# Patient Record
Sex: Male | Born: 1960 | Race: White | Hispanic: No | Marital: Married | Smoking: Never smoker
Health system: Southern US, Community
[De-identification: ages and names within clinical notes are randomized; demographics above are authoritative.]

## PROBLEM LIST (undated history)

## (undated) DIAGNOSIS — L0390 Cellulitis, unspecified: Secondary | ICD-10-CM

## (undated) DIAGNOSIS — L84 Corns and callosities: Secondary | ICD-10-CM

## (undated) HISTORY — DX: Corns and callosities: L84

## (undated) HISTORY — PX: HAND SURGERY: SHX662

## (undated) HISTORY — DX: Cellulitis, unspecified: L03.90

## (undated) HISTORY — PX: VASECTOMY: SHX75

---

## 2004-05-11 ENCOUNTER — Ambulatory Visit: Payer: Self-pay | Admitting: Family Medicine

## 2004-05-11 ENCOUNTER — Encounter: Admission: RE | Admit: 2004-05-11 | Discharge: 2004-05-11 | Payer: Self-pay | Admitting: Family Medicine

## 2004-05-22 ENCOUNTER — Ambulatory Visit: Payer: Self-pay | Admitting: Family Medicine

## 2005-05-27 ENCOUNTER — Ambulatory Visit: Payer: Self-pay | Admitting: Family Medicine

## 2005-06-14 ENCOUNTER — Ambulatory Visit: Payer: Self-pay | Admitting: Family Medicine

## 2005-09-15 IMAGING — CT CT PARANASAL SINUSES LIMITED
1 series · 16 of 30 positions shown, 20 images · IV contrast (agent unspecified)
Comparison: none

CLINICAL DATA: Chronic sinus infections. 
 CT PARANASAL SINUSES W/O CONTRAST: 
 Standard coronal CT imaging of the paranasal sinuses was performed.  The frontal, sphenoid, and maxillary sinuses are clear except for a minimal amount of mucoperiosteal thickening in the maxillary sinuses.  There is also some scattered minimal mucoperiosteal thickening involving the ethmoid sinuses.  The bony nasal septum demonstrates leftward deviation with a large bony spur which narrows the left middle and inferior meatus.  Both inferior turbinates are slightly thickened.  The osteomeatal complexes are patent.

[Series 2: prone coronal · axial · 0.33mm/px · z∈[+41,+127]mm · 16 of 36 slices shown, 20 images]
[im 2/36  brain]
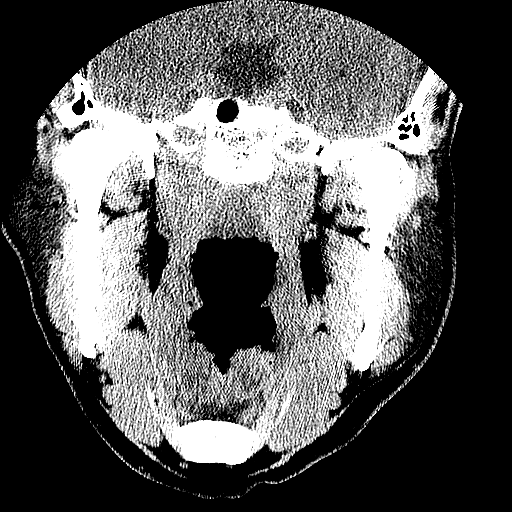
[im 2/36  bone]
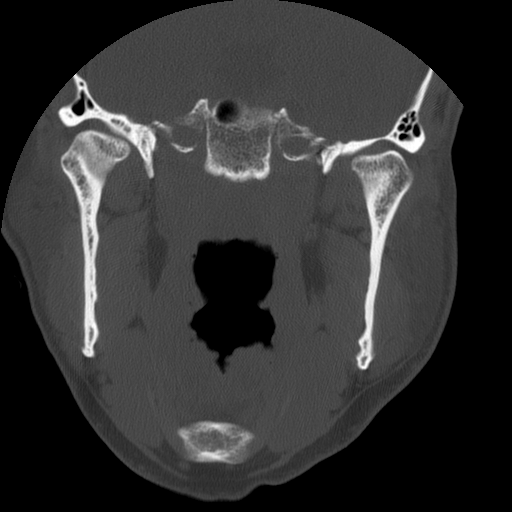
[im 4/36  bone]
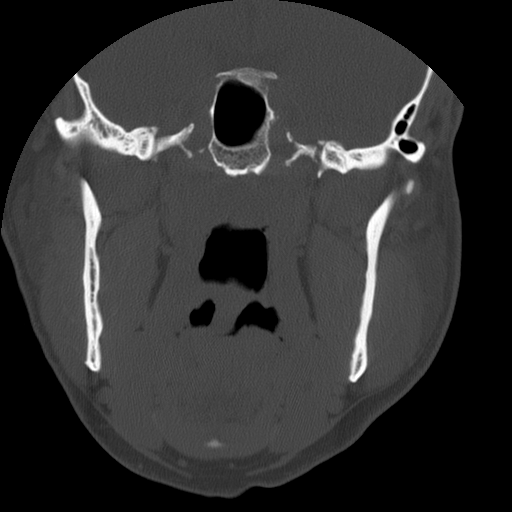
[im 7/36  bone]
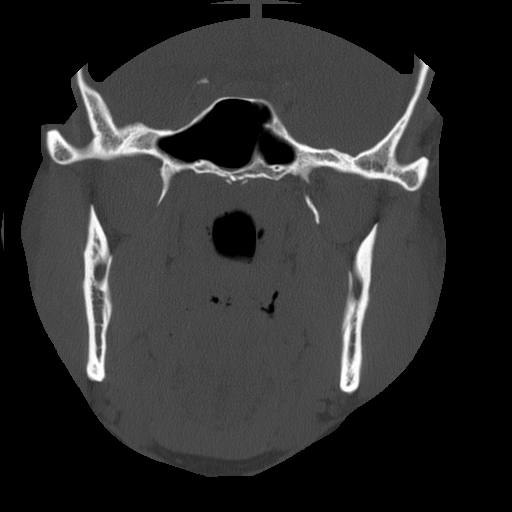
[im 9/36  bone]
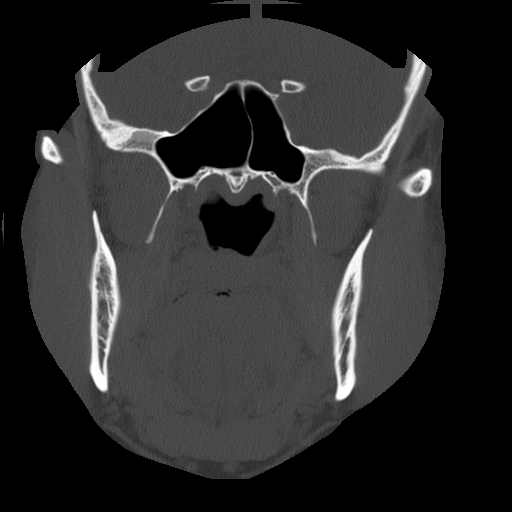
[im 10/36  brain]
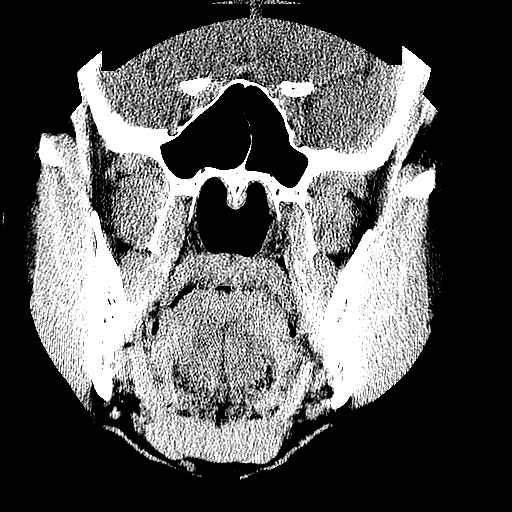
[im 10/36  bone]
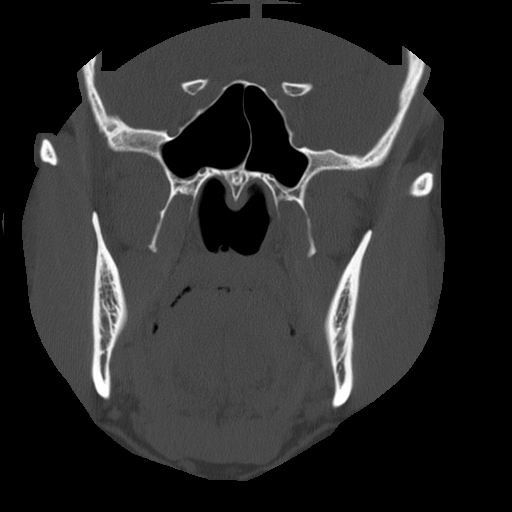
[im 13/36  bone]
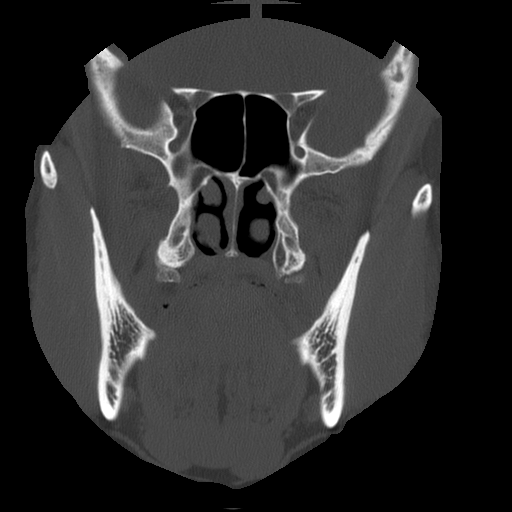
[im 15/36  bone]
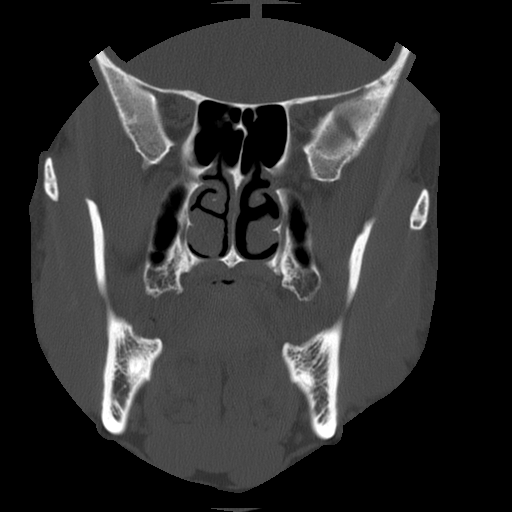
[im 17/36  bone]
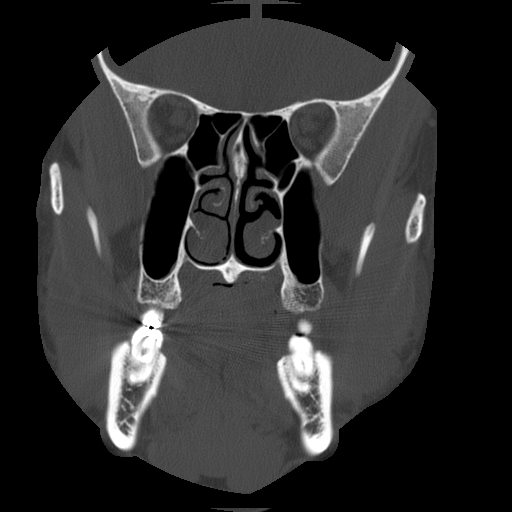
[im 19/36  brain]
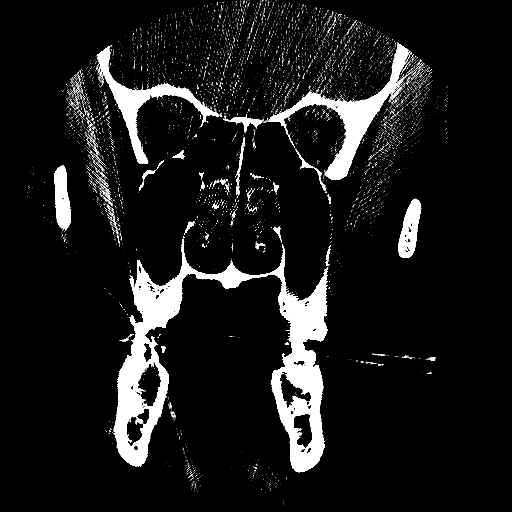
[im 19/36  bone]
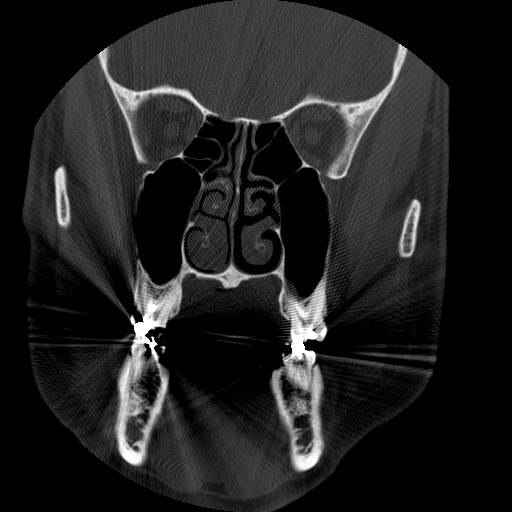
[im 21/36  bone]
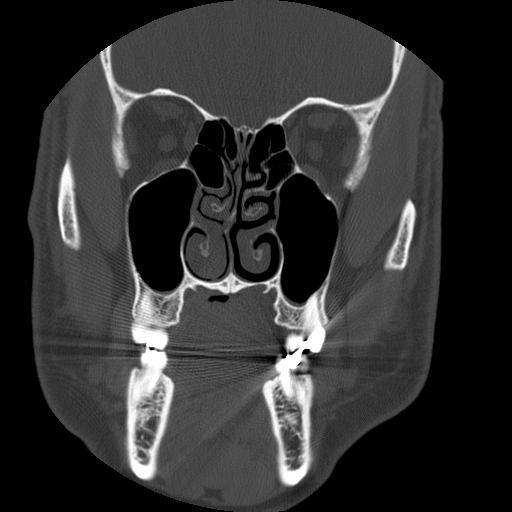
[im 23/36  bone]
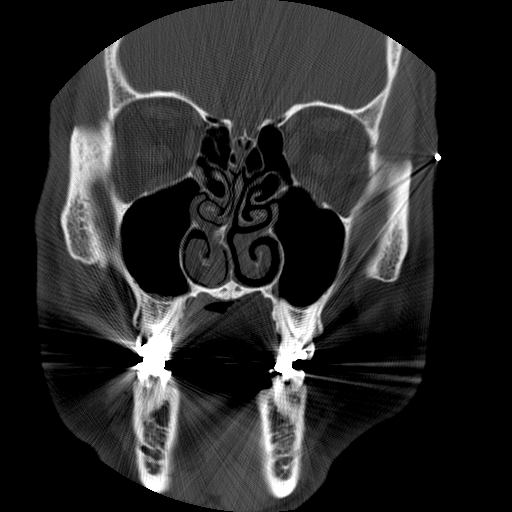
[im 26/36  bone]
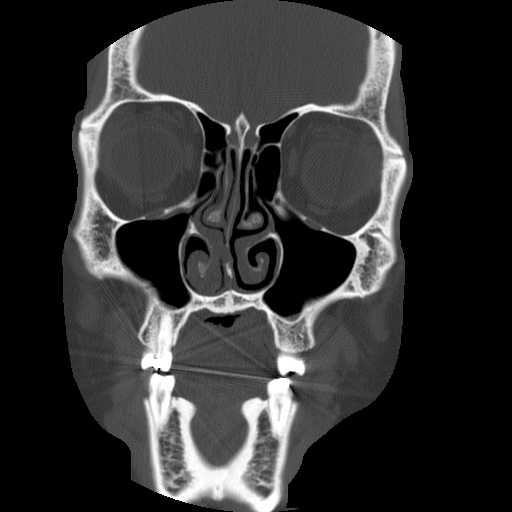
[im 27/36  brain]
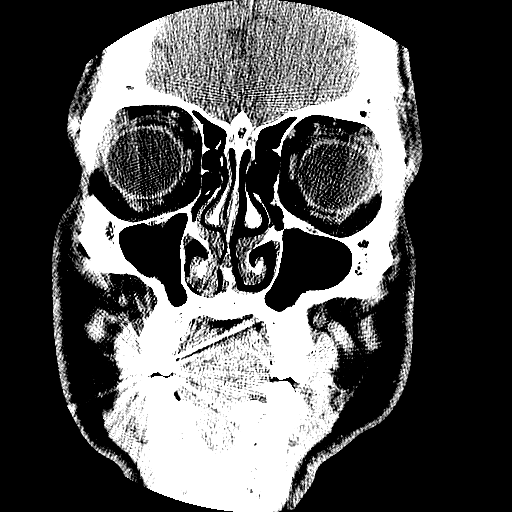
[im 27/36  bone]
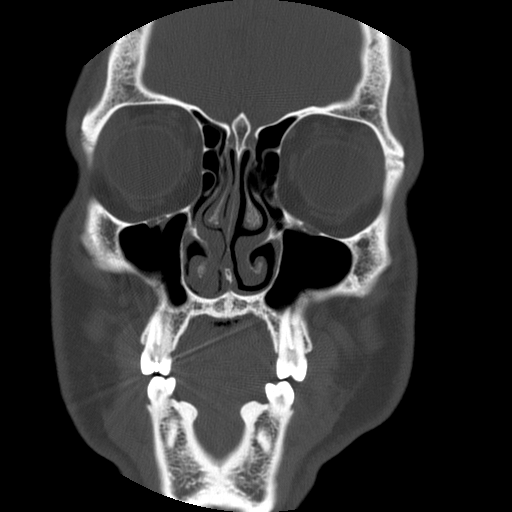
[im 29/36  bone]
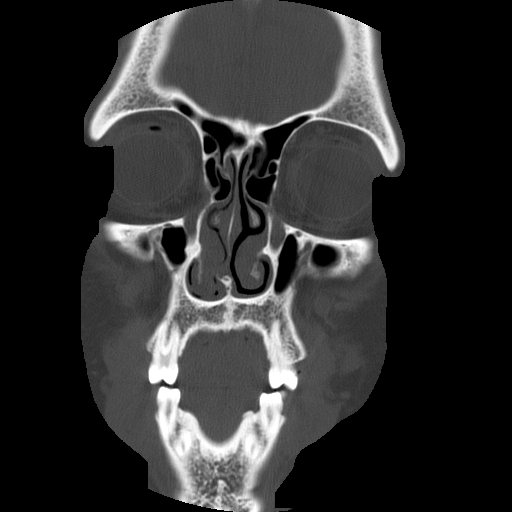
[im 32/36  bone]
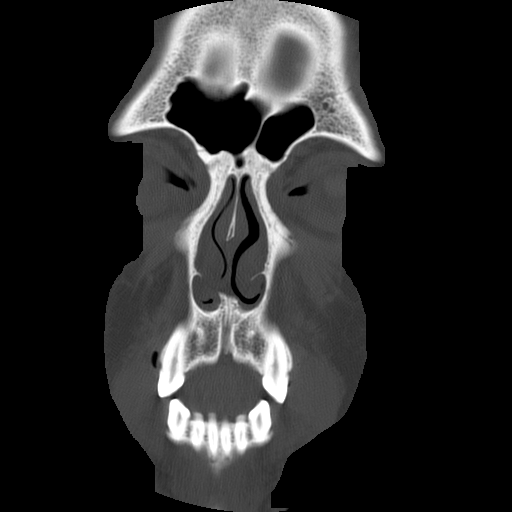
[im 34/36  bone]
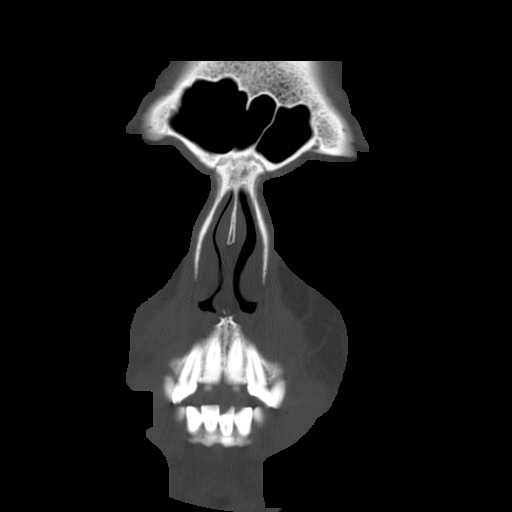

[16 of 30 positions shown; findings below may reference images not displayed]

IMPRESSION: 1.  Minimal mucoperiosteal thickening involving the maxillary and ethmoid sinuses.  No findings to suggest acute sinusitis. 
 2.  Leftward deviation of the bony nasal septum with leftward spurring with mild narrowing of the left middle and inferior meatus.  
 3.  Mild mucosal thickening involving the inferior turbinates.

## 2006-02-01 ENCOUNTER — Ambulatory Visit: Payer: Self-pay | Admitting: Family Medicine

## 2006-06-24 ENCOUNTER — Ambulatory Visit: Payer: Self-pay | Admitting: Family Medicine

## 2007-01-17 ENCOUNTER — Ambulatory Visit: Payer: Self-pay | Admitting: Family Medicine

## 2007-01-17 DIAGNOSIS — J019 Acute sinusitis, unspecified: Secondary | ICD-10-CM

## 2007-03-15 ENCOUNTER — Ambulatory Visit: Payer: Self-pay | Admitting: Family Medicine

## 2007-03-15 DIAGNOSIS — L03039 Cellulitis of unspecified toe: Secondary | ICD-10-CM

## 2007-03-15 DIAGNOSIS — L84 Corns and callosities: Secondary | ICD-10-CM

## 2007-03-15 DIAGNOSIS — L02619 Cutaneous abscess of unspecified foot: Secondary | ICD-10-CM | POA: Insufficient documentation

## 2007-06-21 ENCOUNTER — Telehealth (INDEPENDENT_AMBULATORY_CARE_PROVIDER_SITE_OTHER): Payer: Self-pay | Admitting: *Deleted

## 2007-06-22 ENCOUNTER — Ambulatory Visit: Payer: Self-pay | Admitting: Family Medicine

## 2008-01-21 ENCOUNTER — Emergency Department (HOSPITAL_COMMUNITY): Admission: EM | Admit: 2008-01-21 | Discharge: 2008-01-21 | Payer: Self-pay | Admitting: Family Medicine

## 2008-01-24 ENCOUNTER — Telehealth (INDEPENDENT_AMBULATORY_CARE_PROVIDER_SITE_OTHER): Payer: Self-pay | Admitting: *Deleted

## 2008-01-25 ENCOUNTER — Ambulatory Visit: Payer: Self-pay | Admitting: Family Medicine

## 2008-08-07 ENCOUNTER — Telehealth (INDEPENDENT_AMBULATORY_CARE_PROVIDER_SITE_OTHER): Payer: Self-pay | Admitting: *Deleted

## 2009-03-12 ENCOUNTER — Emergency Department (HOSPITAL_BASED_OUTPATIENT_CLINIC_OR_DEPARTMENT_OTHER): Admission: EM | Admit: 2009-03-12 | Discharge: 2009-03-12 | Payer: Self-pay | Admitting: Emergency Medicine

## 2009-03-12 ENCOUNTER — Ambulatory Visit: Payer: Self-pay | Admitting: Diagnostic Radiology

## 2009-04-15 ENCOUNTER — Telehealth (INDEPENDENT_AMBULATORY_CARE_PROVIDER_SITE_OTHER): Payer: Self-pay | Admitting: *Deleted

## 2009-04-16 ENCOUNTER — Encounter: Payer: Self-pay | Admitting: Family Medicine

## 2009-09-26 ENCOUNTER — Ambulatory Visit: Payer: Self-pay | Admitting: Family Medicine

## 2009-09-26 DIAGNOSIS — J301 Allergic rhinitis due to pollen: Secondary | ICD-10-CM

## 2010-05-31 LAB — CONVERTED CEMR LAB
ALT: 16 units/L (ref 0–53)
AST: 21 units/L (ref 0–37)
Alkaline Phosphatase: 53 units/L (ref 39–117)
Calcium: 9 mg/dL (ref 8.4–10.5)
Eosinophils Relative: 6.6 % — ABNORMAL HIGH (ref 0.0–5.0)
GFR calc non Af Amer: 97.33 mL/min (ref >60)
HCT: 43.3 % (ref 39.0–52.0)
Hemoglobin: 15 g/dL (ref 13.0–17.0)
LDL Cholesterol: 94 mg/dL (ref 0–99)
Lymphocytes Relative: 32 % (ref 12.0–46.0)
Lymphs Abs: 1.2 10*3/uL (ref 0.7–4.0)
Monocytes Relative: 9.3 % (ref 3.0–12.0)
Neutro Abs: 2 10*3/uL (ref 1.4–7.7)
Platelets: 199 10*3/uL (ref 150.0–400.0)
Potassium: 4.3 meq/L (ref 3.5–5.1)
Sodium: 142 meq/L (ref 135–145)
TSH: 1.84 microintl units/mL (ref 0.35–5.50)
Total Bilirubin: 1 mg/dL (ref 0.3–1.2)
VLDL: 7.6 mg/dL (ref 0.0–40.0)
WBC: 3.8 10*3/uL — ABNORMAL LOW (ref 4.5–10.5)

## 2010-06-03 NOTE — Assessment & Plan Note (Signed)
Summary: cpx/lab/cbs   Vital Signs:  Patient profile:   50 year old male Height:      72 inches Weight:      188.38 pounds BMI:     25.64 Pulse rate:   72 / minute Pulse rhythm:   regular BP sitting:   112 / 80  (left arm) Cuff size:   large  Vitals Entered By: Army Fossa CMA (Sep 26, 2009 8:50 AM) CC: Pt here for CPX, pt is fasting.    History of Present Illness: Pt here for cpe and labs.  No complaints.    Preventive Screening-Counseling & Management  Alcohol-Tobacco     Alcohol drinks/day: <1     Alcohol type: beer-ocassionally     Smoking Status: never  Caffeine-Diet-Exercise     Caffeine use/day: 2     Does Patient Exercise: yes     Type of exercise: p90x, gym, weight, run     Exercise (avg: min/session): >60     Times/week: 3-5  Hep-HIV-STD-Contraception     Dental Visit-last 6 months yes     Dental Care Counseling: not indicated; dental care within six months      Sexual History:  currently monogamous and married.        Drug Use:  no.    Current Medications (verified): 1)  Allegra 180 Mg  Tabs (Fexofenadine Hcl) .Marland Kitchen.. 1 By Mouth As Needed 2)  Veramyst 27.5 Mcg/spray Susp (Fluticasone Furoate) .... 2 Sprays Each Nostril Once Daily  Allergies: 1)  ! Pcn 2)  ! Erythromycin  Past History:  Past Medical History: Current Problems:  CELLULITIS, TOE (ICD-681.10) CALLUS, LEFT FOOT (ICD-700) SINUSITIS, ACUTE NOS (ICD-461.9)  Past Surgical History: Vasectomy hand surgery--table saw accident  Family History: Reviewed history from 03/13/2007 and no changes required. Father: DMII Mother:  Siblings: sister-syncope PGF--MI Family History Diabetes 1st degree relative Family History Hypertension  Social History: Reviewed history and no changes required. Occupation:  self employed -- makes Doctor, hospital Never Smoked Alcohol use-yes Drug use-no Regular exercise-yes Smoking Status:  never Caffeine use/day:  2 Dental Care w/in 6 mos.:   yes Sexual History:  currently monogamous, married Occupation:  employed Drug Use:  no  Review of Systems      See HPI General:  Denies chills, fatigue, fever, loss of appetite, malaise, sleep disorder, sweats, weakness, and weight loss. Eyes:  Denies blurring, discharge, double vision, eye irritation, eye pain, halos, itching, light sensitivity, red eye, vision loss-1 eye, and vision loss-both eyes; optho- q2y---+ hard contacts. ENT:  Denies decreased hearing, difficulty swallowing, ear discharge, earache, hoarseness, nasal congestion, nosebleeds, postnasal drainage, ringing in ears, sinus pressure, and sore throat. CV:  Denies bluish discoloration of lips or nails, chest pain or discomfort, difficulty breathing at night, difficulty breathing while lying down, fainting, fatigue, leg cramps with exertion, lightheadness, near fainting, palpitations, shortness of breath with exertion, swelling of feet, swelling of hands, and weight gain. Resp:  Denies chest discomfort, chest pain with inspiration, cough, coughing up blood, excessive snoring, hypersomnolence, morning headaches, pleuritic, shortness of breath, sputum productive, and wheezing. GI:  Denies abdominal pain, bloody stools, change in bowel habits, constipation, dark tarry stools, diarrhea, excessive appetite, gas, hemorrhoids, indigestion, loss of appetite, and nausea. GU:  Denies decreased libido, discharge, dysuria, erectile dysfunction, genital sores, hematuria, incontinence, nocturia, urinary frequency, and urinary hesitancy. MS:  Denies joint pain, joint redness, joint swelling, loss of strength, low back pain, mid back pain, muscle aches, muscle , cramps, muscle weakness, stiffness,  and thoracic pain. Derm:  Denies changes in color of skin, changes in nail beds, dryness, excessive perspiration, flushing, hair loss, insect bite(s), itching, lesion(s), poor wound healing, and rash. Neuro:  Denies brief paralysis, difficulty with  concentration, disturbances in coordination, falling down, headaches, inability to speak, memory loss, numbness, poor balance, seizures, sensation of room spinning, tingling, tremors, visual disturbances, and weakness. Psych:  Denies alternate hallucination ( auditory/visual), anxiety, depression, easily angered, easily tearful, irritability, mental problems, panic attacks, sense of great danger, suicidal thoughts/plans, thoughts of violence, unusual visions or sounds, and thoughts /plans of harming others. Endo:  Denies cold intolerance, excessive hunger, excessive thirst, excessive urination, heat intolerance, polyuria, and weight change. Heme:  Denies abnormal bruising, bleeding, enlarge lymph nodes, fevers, pallor, and skin discoloration. Allergy:  Denies hives or rash, itching eyes, persistent infections, seasonal allergies, and sneezing.  Physical Exam  General:  Well-developed,well-nourished,in no acute distress; alert,appropriate and cooperative throughout examination Head:  Normocephalic and atraumatic without obvious abnormalities. No apparent alopecia or balding. Eyes:  vision grossly intact, pupils equal, pupils round, pupils reactive to light, and no injection.   Ears:  External ear exam shows no significant lesions or deformities.  Otoscopic examination reveals clear canals, tympanic membranes are intact bilaterally without bulging, retraction, inflammation or discharge. Hearing is grossly normal bilaterally. Nose:  External nasal examination shows no deformity or inflammation. Nasal mucosa are pink and moist without lesions or exudates. Mouth:  Oral mucosa and oropharynx without lesions or exudates.  Teeth in good repair. Neck:  No deformities, masses, or tenderness noted.no carotid bruits.   Chest Wall:  No deformities, masses, tenderness or gynecomastia noted. Lungs:  Normal respiratory effort, chest expands symmetrically. Lungs are clear to auscultation, no crackles or  wheezes. Heart:  normal rate and no murmur.   Abdomen:  Bowel sounds positive,abdomen soft and non-tender without masses, organomegaly or hernias noted. Rectal:  No external abnormalities noted. Normal sphincter tone. No rectal masses or tenderness. Genitalia:  Testes bilaterally descended without nodularity, tenderness or masses. No scrotal masses or lesions. No penis lesions or urethral discharge. Prostate:  Prostate gland firm and smooth, no enlargement, nodularity, tenderness, mass, asymmetry or induration. Msk:  normal ROM, no joint tenderness, no joint swelling, no joint warmth, no redness over joints, no joint deformities, no joint instability, and no crepitation.   Pulses:  R posterior tibial normal, R dorsalis pedis normal, R carotid normal, L posterior tibial normal, L dorsalis pedis normal, and L carotid normal.   Extremities:  No clubbing, cyanosis, edema, or deformity noted with normal full range of motion of all joints.   Neurologic:  No cranial nerve deficits noted. Station and gait are normal. Plantar reflexes are down-going bilaterally. DTRs are symmetrical throughout. Sensory, motor and coordinative functions appear intact. Skin:  Intact without suspicious lesions or rashes Cervical Nodes:  No lymphadenopathy noted Psych:  Cognition and judgment appear intact. Alert and cooperative with normal attention span and concentration. No apparent delusions, illusions, hallucinations   Impression & Recommendations:  Problem # 1:  PREVENTIVE HEALTH CARE (ICD-V70.0) ghm utd Orders: Venipuncture (16109) TLB-Lipid Panel (80061-LIPID) TLB-BMP (Basic Metabolic Panel-BMET) (80048-METABOL) TLB-CBC Platelet - w/Differential (85025-CBCD) TLB-Hepatic/Liver Function Pnl (80076-HEPATIC) TLB-TSH (Thyroid Stimulating Hormone) (84443-TSH) TLB-PSA (Prostate Specific Antigen) (84153-PSA) EKG w/ Interpretation (93000)  Reviewed preventive care protocols, scheduled due services, and updated  immunizations.  Problem # 2:  ALLERGIC RHINITIS, SEASONAL (ICD-477.0)  veramyst  allegra  Orders: EKG w/ Interpretation (93000)  Complete Medication List: 1)  Allegra 180  Mg Tabs (Fexofenadine hcl) .Marland Kitchen.. 1 by mouth as needed 2)  Veramyst 27.5 Mcg/spray Susp (Fluticasone furoate) .... 2 sprays each nostril once daily Prescriptions: VERAMYST 27.5 MCG/SPRAY SUSP (FLUTICASONE FUROATE) 2 sprays each nostril once daily  #1 x 5   Entered and Authorized by:   Loreen Freud DO   Signed by:   Loreen Freud DO on 09/26/2009   Method used:   Electronically to        [facility] (retail)       [phone] [address]       [place], [place]  [id number]       Ph: [phone]       Fax: [phone]   RxID:   [id number]    EKG  Procedure date:  09/26/2009  Findings:      Sinus bradycardia with rate of:  52    Appended Document: cpx/lab/cbs  Laboratory Results   Urine Tests   Date/Time Reported: Sep 26, 2009 10:02 AM   Routine Urinalysis   Color: straw Appearance: Clear Glucose: negative   (Normal Range: Negative) Bilirubin: negative   (Normal Range: Negative) Ketone: negative   (Normal Range: Negative) Spec. Gravity: 1.010   (Normal Range: 1.003-1.035) Blood: negative   (Normal Range: Negative) pH: 7.5   (Normal Range: 5.0-8.0) Protein: 30   (Normal Range: Negative) Urobilinogen: 0.2   (Normal Range: 0-1) Nitrite: negative   (Normal Range: Negative) Leukocyte Esterace: negative   (Normal Range: Negative)    Comments: Floydene Flock  Sep 26, 2009 10:03 AM

## 2010-06-11 ENCOUNTER — Encounter: Payer: Self-pay | Admitting: Family Medicine

## 2010-06-11 ENCOUNTER — Ambulatory Visit (INDEPENDENT_AMBULATORY_CARE_PROVIDER_SITE_OTHER): Payer: BC Managed Care – PPO | Admitting: Family Medicine

## 2010-06-11 DIAGNOSIS — R05 Cough: Secondary | ICD-10-CM

## 2010-06-18 NOTE — Assessment & Plan Note (Signed)
Summary: cough, cold, sinus///sph   Vital Signs:  Patient profile:   50 year old male Weight:      191 pounds BMI:     26.00 Temp:     98.4 degrees F oral BP sitting:   120 / 68  (left arm)  Vitals Entered By: Doristine Devoid CMA (June 11, 2010 11:10 AM) CC: sinus congestion and drainage x2 days    History of Present Illness: 50 yo man here today for ? sinus infxn.  reports hx of frequent sinus infxns.  sxs started 2 days ago w/ runny nose, scratchy throat.  having frontal sinus pressure and 'slight' HA.  no ear pain.  cough is productive of 'greenish brown' sputum.  no fevers.  + sick contacts- mom has PNA.  Current Medications (verified): 1)  Allegra 180 Mg  Tabs (Fexofenadine Hcl) .Marland Kitchen.. 1 By Mouth As Needed 2)  Veramyst 27.5 Mcg/spray Susp (Fluticasone Furoate) .... 2 Sprays Each Nostril Once Daily  Allergies (verified): 1)  ! Pcn 2)  ! Erythromycin  Review of Systems      See HPI  Physical Exam  General:  Well-developed,well-nourished,in no acute distress; alert,appropriate and cooperative throughout examination Head:  Normocephalic and atraumatic without obvious abnormalities. No apparent alopecia or balding.  no TTP over sinuses Eyes:  no injxn or inflammation Ears:  External ear exam shows no significant lesions or deformities.  Otoscopic examination reveals clear canals, tympanic membranes are intact bilaterally without bulging, retraction, inflammation or discharge. Hearing is grossly normal bilaterally. Nose:  External nasal examination shows no deformity or inflammation. Nasal mucosa are pink and moist without lesions or exudates. Mouth:  Oral mucosa and oropharynx without lesions or exudates.  Teeth in good repair. Neck:  No deformities, masses, or tenderness noted.no carotid bruits.   Lungs:  coarse BS in RML, otherwise CTA Heart:  normal rate and no murmur.     Impression & Recommendations:  Problem # 1:  COUGH (ICD-786.2) Assessment New suspect PNA given  pt's recent exposure and coarse BS in RML.  start Doxy- discussed starting Avelox but pt is avid runner and not willing to accept risk of tendon rupture.  reviewed supportive care and red flags that should prompt return.  Pt expresses understanding and is in agreement w/ this plan.  Complete Medication List: 1)  Allegra 180 Mg Tabs (Fexofenadine hcl) .Marland Kitchen.. 1 by mouth as needed 2)  Veramyst 27.5 Mcg/spray Susp (Fluticasone furoate) .... 2 sprays each nostril once daily 3)  Doxycycline Hyclate 100 Mg Caps (Doxycycline hyclate) .... Take 1 tab twice a day.  take w/ food.  Patient Instructions: 1)  Take the Doxycycline as directed for a possible early pneumonia 2)  Mucinex to thin your congestion 3)  Add Robitussin or Delsym as needed for cough 4)  Tylenol or Ibuprofen as needed for pain or fever 5)  Drink plenty of fluids 6)  REST! 7)  Call with any questions or concerns- especially if not improving 8)  Hang in there!!! Prescriptions: VERAMYST 27.5 MCG/SPRAY SUSP (FLUTICASONE FUROATE) 2 sprays each nostril once daily  #1 x 5   Entered and Authorized by:   Neena Rhymes MD   Signed by:   Neena Rhymes MD on 06/11/2010   Method used:   Electronically to        [facility] (retail)       [phone]       [place],  [place]  [id number]       Ph: [phone]       Fax: [phone]   RxID:   [id number] DOXYCYCLINE HYCLATE 100 MG CAPS (DOXYCYCLINE HYCLATE) Take 1 tab twice a day.  take w/ food.  #20 x 0   Entered and Authorized by:   Neena Rhymes MD   Signed by:   Neena Rhymes MD on 06/11/2010   Method used:   Electronically to        [facility] (retail)       [phone] [address]       [place], [place]  [id number]       Ph: [phone]       Fax: [phone]   RxID:   [id number]    Orders Added: 1)  Est. Patient Level III [20254]

## 2010-09-15 NOTE — Assessment & Plan Note (Signed)
[facility]                                 ON-CALL NOTE   NAME:James Dodson, James Dodson                       MRN:          [id number]  DATE:01/21/2008                            DOB:          03-02-1961    TIME:  07:47 p.m.   PHONE NUMBER:  [phone].   REGULAR DOCTOR:  He is unsure of his regular doctor.  His wife is the  caller, James Dodson.   CHIEF COMPLIANT:  Fever.   James Dodson said they had both been sick this week with cough,  lightheadedness, headache, and fever.  She started hers on Wednesday and  it is starting to get better.  She is coughing a bit, but he has a  temperature of 102 that seems to be going up and up  and is feeling very  bad.  She wants to take him in for more further evaluation.  I told her  to go ahead and take him to the emergency room or to come down here for  evaluation now.  He could have a flu or possible pneumonia, or another  viral syndrome.  I also advised her that he can take Tylenol or Advil  for fever to try to keep it down in the meantime.     Marne A. Tower, MD  Electronically Signed    MAT/MedQ  DD: 01/21/2008  DT: 01/21/2008  Job #: [id number]

## 2011-01-15 ENCOUNTER — Ambulatory Visit (INDEPENDENT_AMBULATORY_CARE_PROVIDER_SITE_OTHER): Payer: BC Managed Care – PPO | Admitting: Family

## 2011-01-15 ENCOUNTER — Encounter: Payer: Self-pay | Admitting: Family Medicine

## 2011-01-15 ENCOUNTER — Encounter: Payer: Self-pay | Admitting: Family

## 2011-01-15 VITALS — BP 124/86 | HR 64 | Temp 98.4°F | Resp 12 | Ht 72.01 in | Wt 201.1 lb

## 2011-01-15 DIAGNOSIS — J329 Chronic sinusitis, unspecified: Secondary | ICD-10-CM

## 2011-01-15 MED ORDER — DOXYCYCLINE HYCLATE 100 MG PO CAPS: 100.0000 mg | ORAL_CAPSULE | Freq: Two times a day (BID) | ORAL | Status: AC

## 2011-01-15 NOTE — Assessment & Plan Note (Signed)
Will plan to treat with doxycycline given allergy profile.   Pt is instructed to continue his allegra and Veramyst and contact us if symptoms worsen or if they fail to improve in 2-3 days. Pt verbalizes understanding.

## 2011-01-15 NOTE — Progress Notes (Signed)
  Subjective:    Patient ID: James Dodson, male    DOB: 08-04-1960, 50 y.o.   MRN: [id number]  HPI  James Dodson is a 50 yr old male who presents today with cc of sinus drainage. Symptoms started Wednesday. Notes + chills, denies fever.    Had sore throat yesterday. + cough becoming yellow/green in color.  Mild dizziness, feels drained and weak.   Review of Systems See HPI  Past Medical History  Diagnosis Date  . Cellulitis     toe  . Callus     left toe    History   Social History  . Marital Status: Married    Spouse Name: N/A    Number of Children: N/A  . Years of Education: N/A   Occupational History  . Not on file.   Social History Main Topics  . Smoking status: Never Smoker   . Smokeless tobacco: Never Used  . Alcohol Use: Yes  . Drug Use: No  . Sexually Active: Not on file   Other Topics Concern  . Not on file   Social History Narrative   Self employed--makes furnitureRegular exercise:  Yes    Past Surgical History  Procedure Date  . Vasectomy   . Hand surgery     table saw accident    Family History  Problem Relation Age of Onset  . Diabetes Father   . Heart attack Paternal Grandfather     Allergies  Allergen Reactions  . Erythromycin   . Penicillins     No current outpatient prescriptions on file prior to visit.    BP 124/86  Pulse 64  Temp(Src) 98.4 F (36.9 C) (Oral)  Resp 12  Ht 6' 0.01" (1.829 m)  Wt 201 lb 1.9 oz (91.227 kg)  BMI 27.27 kg/m2  SpO2 96%       Objective:   Physical Exam  Constitutional: He appears well-developed and well-nourished.  HENT:  Head: Normocephalic and atraumatic.  Right Ear: Tympanic membrane and ear canal normal.  Left Ear: Tympanic membrane and ear canal normal.  Mouth/Throat: Uvula is midline, oropharynx is clear and moist and mucous membranes are normal.       No significant sinus tenderness to palpation.   Cardiovascular: Normal rate and regular rhythm.   No murmur  heard. Pulmonary/Chest: Effort normal and breath sounds normal. No respiratory distress. He has no wheezes. He has no rales.  Psychiatric: He has a normal mood and affect. His behavior is normal. Judgment and thought content normal.          Assessment & Plan:

## 2011-01-15 NOTE — Patient Instructions (Signed)
Call if your symptoms worsen, if fever over 101, or if you are not feeling better in 2-3 days.

## 2012-01-05 ENCOUNTER — Telehealth: Payer: Self-pay | Admitting: Family Medicine

## 2012-01-05 NOTE — Telephone Encounter (Signed)
Refill: Veramyst 27.5 mcg/spray susp. 2 sprays each nostril once daily. Last fill 04-19-11

## 2012-01-05 NOTE — Telephone Encounter (Signed)
Pt needs ov---can refill 1 only

## 2012-01-05 NOTE — Telephone Encounter (Signed)
FYI:Pt last OV noted 01-15-11 with Sandford Craze for sinus, noted last OV with MD Lowne as CPE 09-26-09

## 2012-01-06 MED ORDER — FLUTICASONE FUROATE 27.5 MCG/SPRAY NA SUSP: NASAL | Status: DC

## 2012-04-05 ENCOUNTER — Telehealth: Payer: Self-pay | Admitting: Family Medicine

## 2012-04-05 NOTE — Telephone Encounter (Signed)
Noted, patient to be seen tomorrow

## 2012-04-05 NOTE — Telephone Encounter (Signed)
Patient Information:  Caller Name: Doyne  Phone: [phone]  Patient: James Dodson, James Dodson  Gender: Male  DOB: 26-Jan-1961  Age: 51 Years  PCP: Lelon Perla.   Symptoms  Reason For Call & Symptoms: started with congestion and sore throat; having some drainage; denies HA or pressure; coughing up clear mucus; having some chest tightness; h/o pneumonia; slight dizziness  Reviewed Health History In EMR: Yes  Reviewed Medications In EMR: Yes  Reviewed Allergies In EMR: Yes  Reviewed Surgeries / Procedures: Yes  Date of Onset of Symptoms: 04/02/2012  Treatments Tried: Mucinex, Allegra, Veramyst  Treatments Tried Worked: No  Guideline(s) Used:  Cough  Disposition Per Guideline:   Go to Office Now  Reason For Disposition Reached:   Wheezing is present  Advice Given:  Reassurance  Coughing is the way that our lungs remove irritants and mucus. It helps protect our lungs from getting pneumonia.  Prevent Dehydration:  Drink adequate liquids.  Coughing Spasms:  Drink warm fluids. Inhale warm mist (Reason: both relax the airway and loosen up the phlegm).  Avoid Tobacco Smoke:  Smoking or being exposed to smoke makes coughs much worse.  Expected Course:   Viral bronchitis (chest cold) causes a cough that lasts 1 to 3 weeks. Sometimes you may cough up lots of phlegm (sputum, mucus). The mucus can normally be white, gray, yellow, or green.  Call Back If:  Difficulty breathing  Cough lasts more than 3 weeks  Call Back If:  Difficulty breathing  Cough lasts more than 3 weeks  You become worse.  Office Follow Up:  Does the office need to follow up with this patient?: Yes  Instructions For The Office: Pt c/o slight wheezing so needed appt today, but none available in epic

## 2012-04-05 NOTE — Telephone Encounter (Signed)
Spoke with patient, made appt tomorrow with Dr. Alwyn Ren at 1:30pm. Marlinda Mike, RN

## 2012-04-06 ENCOUNTER — Ambulatory Visit (INDEPENDENT_AMBULATORY_CARE_PROVIDER_SITE_OTHER): Payer: BC Managed Care – PPO | Admitting: Internal Medicine

## 2012-04-06 ENCOUNTER — Encounter: Payer: Self-pay | Admitting: Internal Medicine

## 2012-04-06 VITALS — BP 126/80 | HR 60 | Temp 97.7°F | Wt 215.6 lb

## 2012-04-06 DIAGNOSIS — J209 Acute bronchitis, unspecified: Secondary | ICD-10-CM

## 2012-04-06 MED ORDER — DOXYCYCLINE HYCLATE 100 MG PO TABS: 100.0000 mg | ORAL_TABLET | Freq: Two times a day (BID) | ORAL | Status: DC

## 2012-04-06 NOTE — Patient Instructions (Addendum)
Plain Mucinex for thick secretions ;force NON dairy fluids . Use a Neti pot daily as needed for sinus congestion; going from open side to congested side . Nasal cleansing in the shower as discussed. Make sure that all residual soap is removed to prevent irritation.Veramyst 1 spray in each nostril twice a day as needed. Use the "crossover" technique as discussed. Plain Allegra 160 daily as needed for itchy eyes & sneezing.

## 2012-04-06 NOTE — Progress Notes (Signed)
  Subjective:    Patient ID: James Dodson, male    DOB: April 11, 1961, 51 y.o.   MRN: [id number]  HPI  Symptoms began 04/02/12 as an increase in postnasal drainage associated with some sore throat. He developed a cough which has been dry for the most part; he does have a loose cough in the mornings.  He may have had some wheezing; he denies shortness of breath. He has had slight dizziness with the present illness.  He has taken his nonsedating antihistamines, nasal steroid, and guaifenesin.  These are symptoms he typically has this time of year. Last year he had pneumonia. He has never smoked. He does have  history of allergic seasonal rhinitis   Review of Systems He denies associated extrinsic symptoms such as itchy, watery eyes or sneezing. He's had no fever, chills, or sweats. He also has had no purulence from his nose, dental pain, or otic pain     Objective:   Physical Exam General appearance:good health ;well nourished; no acute distress or increased work of breathing is present.  No  lymphadenopathy about the head, neck, or axilla noted.   Eyes: No conjunctival inflammation or lid edema is present.   Ears:  External ear exam shows no significant lesions or deformities.  Otoscopic examination reveals wax on L Nose:  External nasal examination shows no deformity or inflammation. Nasal mucosa are dry without lesions or exudates. No septal dislocation or deviation.No obstruction to airflow.   Oral exam: Dental hygiene is good; lips and gums are healthy appearing.There is no oropharyngeal erythema or exudate noted.   Neck:  No deformities,  masses, or tenderness noted.      Heart:  Normal rate and regular rhythm. S1 and S2 normal without gallop, murmur, click, rub or other extra sounds.   Lungs:Chest clear to auscultation; no wheezes, rhonchi,rales ,or rubs present.No increased work of breathing.    Extremities:  No cyanosis, edema, or clubbing  noted    Skin: Warm & dry            Assessment & Plan:  #1 acute bronchitis w/o bronchospasm Plan: See orders and recommendations

## 2013-05-25 ENCOUNTER — Encounter (INDEPENDENT_AMBULATORY_CARE_PROVIDER_SITE_OTHER): Payer: Self-pay

## 2013-05-25 ENCOUNTER — Encounter: Payer: Self-pay | Admitting: Family Medicine

## 2013-05-25 ENCOUNTER — Ambulatory Visit (INDEPENDENT_AMBULATORY_CARE_PROVIDER_SITE_OTHER): Payer: BC Managed Care – PPO | Admitting: Family Medicine

## 2013-05-25 VITALS — BP 134/88 | HR 88 | Temp 98.8°F | Resp 16 | Wt 227.4 lb

## 2013-05-25 DIAGNOSIS — J209 Acute bronchitis, unspecified: Secondary | ICD-10-CM

## 2013-05-25 DIAGNOSIS — R6882 Decreased libido: Secondary | ICD-10-CM | POA: Insufficient documentation

## 2013-05-25 DIAGNOSIS — J329 Chronic sinusitis, unspecified: Secondary | ICD-10-CM

## 2013-05-25 MED ORDER — SULFAMETHOXAZOLE-TMP DS 800-160 MG PO TABS: 1.0000 | ORAL_TABLET | Freq: Two times a day (BID) | ORAL | Status: DC

## 2013-05-25 MED ORDER — IPRATROPIUM-ALBUTEROL 0.5-2.5 (3) MG/3ML IN SOLN
3.0000 mL | Freq: Once | RESPIRATORY_TRACT | Status: AC
  Administered 2013-05-25: 3 mL via RESPIRATORY_TRACT

## 2013-05-25 MED ORDER — SILDENAFIL CITRATE 100 MG PO TABS: 50.0000 mg | ORAL_TABLET | Freq: Every day | ORAL | Status: DC | PRN

## 2013-05-25 MED ORDER — PROMETHAZINE-DM 6.25-15 MG/5ML PO SYRP: 5.0000 mL | ORAL_SOLUTION | Freq: Four times a day (QID) | ORAL | Status: DC | PRN

## 2013-05-25 NOTE — Patient Instructions (Signed)
Follow up as needed Start the Bactrim twice daily for the sinus infection- take w/ food Drink plenty of fluids REST! Cough syrup as needed- will cause drowsiness Mucinex DM for daytime cough We'll notify you of your lab results and make any changes if needed Start the Viagra as needed Hang in there!!

## 2013-05-25 NOTE — Progress Notes (Signed)
   Subjective:    Patient ID: James Holteronald Boesch Jr., male    DOB: 02/04/1961, 53 y.o.   MRN: [id number]  HPI URI- sxs started 1 week ago w/ sore throat.  Has had similar sxs previously.  + PND.  Using [employer], taking Allegra, Mucinex.  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  Now w/ cough productive of brown sputum as well as brown nasal drainage.  + HA, facial pain.  'horrible cough'.  + sick contacts.  No N/V/D.  Decreased Libido- pt reports that he has had sex twice in 2 yrs and has 'no interest'.  Wife is frustrated by this.  Pt also having ED.  Weight gain.   Review of Systems For ROS see HPI     Objective:   Physical Exam  Vitals reviewed. Constitutional: He appears well-developed and well-nourished. No distress.  HENT:  Head: Normocephalic and atraumatic.  Right Ear: Tympanic membrane normal.  Left Ear: Tympanic membrane normal.  Nose: Mucosal edema and rhinorrhea present. Right sinus exhibits maxillary sinus tenderness and frontal sinus tenderness. Left sinus exhibits maxillary sinus tenderness and frontal sinus tenderness.  Mouth/Throat: Mucous membranes are normal. Oropharyngeal exudate and posterior oropharyngeal erythema present. No posterior oropharyngeal edema.  + PND  Eyes: Conjunctivae and EOM are normal. Pupils are equal, round, and reactive to light.  Neck: Normal range of motion. Neck supple.  Cardiovascular: Normal rate, regular rhythm and normal heart sounds.   Pulmonary/Chest: Effort normal. No respiratory distress. He has wheezes (diffuse expiratory wheezes). He has rales (coarse BS diffusely).  + hacking cough  Lymphadenopathy:    He has no cervical adenopathy.  Skin: Skin is warm and dry.          Assessment & Plan:

## 2013-05-27 NOTE — Assessment & Plan Note (Signed)
New.  Pt's sxs consistent w/ low T- check labs.  If low, will need testosterone therapy via PCP or Endo. In the meantime, start Viagra for symptom improvement.  Pt expressed understanding and is in agreement w/ plan.

## 2013-05-27 NOTE — Assessment & Plan Note (Signed)
New.  Pt's wheezing, rhonchi and air movement improved s/p neb tx.  Start abx.  Cough meds prn.  Reviewed supportive care and red flags that should prompt return.  Pt expressed understanding and is in agreement w/ plan.

## 2013-05-27 NOTE — Assessment & Plan Note (Signed)
New to provider.  Start abx.  Reviewed supportive care and red flags that should prompt return.  Pt expressed understanding and is in agreement w/ plan.

## 2013-05-28 ENCOUNTER — Telehealth: Payer: Self-pay | Admitting: *Deleted

## 2013-05-28 ENCOUNTER — Other Ambulatory Visit: Payer: Self-pay | Admitting: Family Medicine

## 2013-05-28 DIAGNOSIS — R7989 Other specified abnormal findings of blood chemistry: Secondary | ICD-10-CM

## 2013-05-28 LAB — TESTOSTERONE, FREE, TOTAL, SHBG
Sex Hormone Binding: 39 nmol/L (ref 13–71)
TESTOSTERONE-% FREE: 1.8 % (ref 1.6–2.9)
TESTOSTERONE: 295 ng/dL — AB (ref 300–890)
Testosterone, Free: 52.2 pg/mL (ref 47.0–244.0)

## 2013-05-28 NOTE — Telephone Encounter (Signed)
Left message for patient to return call to office with the medication information

## 2013-05-28 NOTE — Telephone Encounter (Signed)
Patient called back and stated that he was given a coupon for Viagra. Advised patient that we do not have anymore coupons but did offer him sample of Viagra 50mg . Patient states that he will like to try them. Samples placed up front for patient.

## 2013-05-28 NOTE — Telephone Encounter (Signed)
Patient called and stated that he was seen in office on 05/25/2013. Patient was giving a coupon card for medication. Patient states that he went to the pharmacy but the coupon could not be used because it had expired. Call patient back to clarify which medication it was, but patient didn't answer. Please advise. SW

## 2013-05-28 NOTE — Telephone Encounter (Signed)
Please determine which med pt needs...will do our best to find another coupon

## 2013-05-31 ENCOUNTER — Ambulatory Visit (INDEPENDENT_AMBULATORY_CARE_PROVIDER_SITE_OTHER): Payer: BC Managed Care – PPO | Admitting: Endocrinology

## 2013-05-31 ENCOUNTER — Encounter: Payer: Self-pay | Admitting: Endocrinology

## 2013-05-31 VITALS — BP 128/82 | HR 72 | Temp 98.2°F | Resp 14 | Ht 73.0 in | Wt 229.0 lb

## 2013-05-31 DIAGNOSIS — R400 Somnolence: Secondary | ICD-10-CM

## 2013-05-31 DIAGNOSIS — G4710 Hypersomnia, unspecified: Secondary | ICD-10-CM

## 2013-05-31 DIAGNOSIS — R7989 Other specified abnormal findings of blood chemistry: Secondary | ICD-10-CM

## 2013-05-31 DIAGNOSIS — E291 Testicular hypofunction: Secondary | ICD-10-CM

## 2013-05-31 DIAGNOSIS — R635 Abnormal weight gain: Secondary | ICD-10-CM

## 2013-05-31 DIAGNOSIS — R5381 Other malaise: Secondary | ICD-10-CM

## 2013-05-31 DIAGNOSIS — R5383 Other fatigue: Secondary | ICD-10-CM

## 2013-05-31 NOTE — Progress Notes (Signed)
Patient ID: James Dodson., male   DOB: 01-Aug-1960, 53 y.o.   MRN: [id number]   Chief complaint:   Decreased libido  History of Present Illness  For the last 2 years or so he has had very little libido and desire. He also is overall feeling that he is less motivated both at work and with his usual sporting activities and exercise routine He does tend to get tired more easily at the end of the day. Does not think that his muscle strength is decreased He does have some mild stress; does have difficulty sleeping for the last few years. His mood may be low at times. For the last 6 months he has gained about 20 pounds from decrease physical activity especially related to his knee joint pains Also he feels that he has had decreased erectile function and has very little nocturnal erections also  He thinks he may have had mumps during childhood but also h had respiratory infections and swollen neck glands Does not have any history of head injury, testicular injury, headaches or visual difficulties   Lab Results  Component Value Date   TESTOSTERONE 295* 05/25/2013   His testosterone level was drawn at about noon and his free testosterone was low normal at 52 Has had no other labs and is now referred here for further management     Medication List       This list is accurate as of: 05/31/13 11:23 AM.  Always use your most recent med list.               fexofenadine 180 MG tablet  Commonly known as:  ALLEGRA  Take 180 mg by mouth daily as needed.     fluticasone 27.5 MCG/SPRAY nasal spray  Commonly known as:  VERAMYST  2 spray into the nose daily(Office visit due now)     NASACORT ALLERGY 24HR 55 MCG/ACT Aero nasal inhaler  Generic drug:  triamcinolone  Place 2 sprays into the nose daily.     promethazine-dextromethorphan 6.25-15 MG/5ML syrup  Commonly known as:  PROMETHAZINE-DM  Take 5 mLs by mouth 4 (four) times daily as needed.     sildenafil 100 MG tablet  Commonly  known as:  VIAGRA  Take 0.5-1 tablets (50-100 mg total) by mouth daily as needed for erectile dysfunction.     sulfamethoxazole-trimethoprim 800-160 MG per tablet  Commonly known as:  BACTRIM DS  Take 1 tablet by mouth 2 (two) times daily.        Allergies:  Allergies  Allergen Reactions  . Penicillins     His father had anaphylaxis; he's never taken PCN @ advice of Allergist  . Erythromycin     GI intolerance    Past Medical History  Diagnosis Date  . Cellulitis     toe  . Callus     left toe    Past Surgical History  Procedure Laterality Date  . Vasectomy    . Hand surgery      table saw accident    Family History  Problem Relation Age of Onset  . Diabetes Father   . Heart attack Paternal Grandfather     Social History:  reports that he has never smoked. He has never used smokeless tobacco. He reports that he drinks alcohol. He reports that he does not use illicit drugs.  Review of Systems  HENT:       Has had allergic rhinitis  Eyes: Negative for blurred vision.  Neurological: Negative for  tingling and headaches.    He may tend to feel sleepy while driving for an hour or so. This is relatively new He has no insomnia and occasionally will have snoring. No ankle swelling No history of diabetes in the past but no recent labs available on the record  General Examination:   BP 128/82  Pulse 72  Temp(Src) 98.2 F (36.8 C)  Resp 14  Ht 6\' 1"  (1.854 m)  Wt 229 lb (103.874 kg)  BMI 30.22 kg/m2  SpO2 96%  GENERAL APPEARANCE: Well-built and nourished, no cushingoid features, minimal obesity SKIN:normal, no rash or pigmentation.  HEENT:Oral mucosa normal.  EYES:normal external appearance of eyes, Fundi benign.   NECK:no lymphadenopathy, no thyromegaly.  LUNGS:clear to auscultation bilaterally, no wheezes, rhonchi, rales.   HEART:normal S1 And S2, no S3, S4, murmur or click.  ABDOMEN:no hepatosplenomegaly, no  masses palpated, soft and not tender.   MALE GENITOURINARY: Testicles are about 3-3.5 cm, normal texture MUSCULOSKELETALNo enlargement or deformity of joints.  EXTREMITIES:no clubbing, no edema.  NEUROLOGIC EXAM: Biceps and ankle reflexes difficult to elicit. Monofilament sensation normal in the toes   Assessment/ Plan:   ? Hypogonadism. Although he has a slightly low total testosterone has free testosterone is within normal range His labs were drawn midday and not clear if this is a true level His main symptoms are decreased libido and lack of motivation. However these are rather nonspecific symptoms Overall he is also having multiple other issues including weight gain, some daytime somnolence and a low mood.   Physical examination unremarkable today.  Will need to assess his free testosterone level fasting in the morning Also do need to check [facility] level as well as prolactin to assess his pituitary function and rule out a prolactinoma Because of his weight gain will need to rule out prediabetes and have fasting glucose and chemistry panel checked Have discussed briefly the causes of hypogonadism and how the supplementation is done if required Most likely if he needs supplementation it would be with a transdermal preparation for more consistent day to day levels  ? Sleep apnea. Will defer assessment to the primary care physician  James Dodson 05/31/2013, 11:23 AM

## 2013-06-01 ENCOUNTER — Other Ambulatory Visit (INDEPENDENT_AMBULATORY_CARE_PROVIDER_SITE_OTHER): Payer: BC Managed Care – PPO

## 2013-06-01 DIAGNOSIS — R5383 Other fatigue: Secondary | ICD-10-CM

## 2013-06-01 DIAGNOSIS — E291 Testicular hypofunction: Secondary | ICD-10-CM

## 2013-06-01 DIAGNOSIS — R5381 Other malaise: Secondary | ICD-10-CM

## 2013-06-01 DIAGNOSIS — R7989 Other specified abnormal findings of blood chemistry: Secondary | ICD-10-CM

## 2013-06-01 LAB — COMPREHENSIVE METABOLIC PANEL
ALT: 32 U/L (ref 0–53)
AST: 27 U/L (ref 0–37)
Albumin: 3.9 g/dL (ref 3.5–5.2)
Alkaline Phosphatase: 82 U/L (ref 39–117)
BILIRUBIN TOTAL: 0.8 mg/dL (ref 0.3–1.2)
BUN: 14 mg/dL (ref 6–23)
CO2: 21 meq/L (ref 19–32)
CREATININE: 1.1 mg/dL (ref 0.4–1.5)
Calcium: 9.3 mg/dL (ref 8.4–10.5)
Chloride: 110 mEq/L (ref 96–112)
GFR: 77.5 mL/min (ref >60.00)
GLUCOSE: 97 mg/dL (ref 70–99)
Potassium: 4.6 mEq/L (ref 3.5–5.1)
Sodium: 145 mEq/L (ref 135–145)
Total Protein: 7.8 g/dL (ref 6.0–8.3)

## 2013-06-01 LAB — TSH: TSH: 2.1 u[IU]/mL (ref 0.35–5.50)

## 2013-06-01 LAB — LUTEINIZING HORMONE: LH: 3.33 m[IU]/mL (ref 1.50–9.30)

## 2013-06-01 LAB — T4, FREE: FREE T4: 0.84 ng/dL (ref 0.60–1.60)

## 2013-06-02 LAB — PROLACTIN: PROLACTIN: 8.5 ng/mL (ref 2.1–17.1)

## 2013-06-03 NOTE — Progress Notes (Signed)
Quick Note:  Please let patient know that the morning total testosterone and other tests are normal. Will update this if the (pending) free testosterone is low; however do not think testosterone supplementation is indicated, to followup with PCP ______

## 2013-06-04 LAB — TESTOSTERONE, FREE, TOTAL, SHBG
Sex Hormone Binding: 34 nmol/L (ref 13–71)
Testosterone, Free: 81 pg/mL (ref 47.0–244.0)
Testosterone-% Free: 2 % (ref 1.6–2.9)
Testosterone: 405 ng/dL (ref 300–890)

## 2013-08-10 ENCOUNTER — Ambulatory Visit (INDEPENDENT_AMBULATORY_CARE_PROVIDER_SITE_OTHER): Payer: BC Managed Care – PPO | Admitting: Family Medicine

## 2013-08-10 ENCOUNTER — Encounter: Payer: Self-pay | Admitting: Family Medicine

## 2013-08-10 VITALS — BP 110/70 | HR 65 | Temp 97.7°F | Wt 236.0 lb

## 2013-08-10 DIAGNOSIS — J329 Chronic sinusitis, unspecified: Secondary | ICD-10-CM

## 2013-08-10 DIAGNOSIS — Z0000 Encounter for general adult medical examination without abnormal findings: Secondary | ICD-10-CM

## 2013-08-10 MED ORDER — FLUTICASONE FUROATE 27.5 MCG/SPRAY NA SUSP: NASAL | Status: DC

## 2013-08-10 MED ORDER — SULFAMETHOXAZOLE-TMP DS 800-160 MG PO TABS: 1.0000 | ORAL_TABLET | Freq: Two times a day (BID) | ORAL | Status: DC

## 2013-08-10 NOTE — Progress Notes (Signed)
  Subjective:     James HolterRonald Gaultney Jr. is a 53 y.o. male who presents for evaluation of sinus pain. Symptoms include: congestion, facial pain, headaches, nasal congestion, sinus pressure and sneezing. Onset of symptoms was 1 week ago. Symptoms have been gradually worsening since that time. Past history is significant for no history of pneumonia or bronchitis. Patient is a non-smoker.  Pt is taking allegra and flonase and it is worse.    The following portions of the patient's history were reviewed and updated as appropriate: allergies, current medications, past family history, past medical history, past social history, past surgical history and problem list.  Review of Systems Pertinent items are noted in HPI.   Objective:    BP 110/70  Pulse 65  Temp(Src) 97.7 F (36.5 C) (Oral)  Wt 236 lb (107.049 kg)  SpO2 97% General appearance: alert, cooperative, appears stated age and no distress Ears: normal TM's and external ear canals both ears Nose: green discharge, moderate congestion, turbinates red, swollen, sinus tenderness bilateral Throat: lips, mucosa, and tongue normal; teeth and gums normal Neck: moderate anterior cervical adenopathy, supple, symmetrical, trachea midline and thyroid not enlarged, symmetric, no tenderness/mass/nodules Lungs: clear to auscultation bilaterally Heart: S1, S2 normal    Assessment:    Acute bacterial sinusitis.    Plan:    Nasal steroids per medication orders. Antihistamines per medication orders. Bactrim per medication orders. f/u prn

## 2013-08-10 NOTE — Patient Instructions (Signed)

## 2013-08-10 NOTE — Progress Notes (Signed)
Pre visit review using our clinic review tool, if applicable. No additional management support is needed unless otherwise documented below in the visit note. 

## 2013-09-10 ENCOUNTER — Encounter: Payer: Self-pay | Admitting: Family Medicine

## 2013-10-08 ENCOUNTER — Encounter: Payer: BC Managed Care – PPO | Admitting: Family Medicine

## 2014-02-04 ENCOUNTER — Encounter: Payer: Self-pay | Admitting: Medical

## 2014-02-04 ENCOUNTER — Ambulatory Visit (INDEPENDENT_AMBULATORY_CARE_PROVIDER_SITE_OTHER): Payer: BC Managed Care – PPO | Admitting: Medical

## 2014-02-04 ENCOUNTER — Ambulatory Visit: Payer: Self-pay | Admitting: Medical

## 2014-02-04 ENCOUNTER — Other Ambulatory Visit (HOSPITAL_COMMUNITY)
Admission: RE | Admit: 2014-02-04 | Discharge: 2014-02-04 | Disposition: A | Discharge status: Home / Self Care | Payer: BC Managed Care – PPO | Source: Ambulatory Visit | Attending: Medical | Admitting: Medical

## 2014-02-04 VITALS — BP 135/83 | HR 76 | Temp 98.6°F | Ht 71.0 in | Wt 226.0 lb

## 2014-02-04 DIAGNOSIS — Z113 Encounter for screening for infections with a predominantly sexual mode of transmission: Secondary | ICD-10-CM | POA: Diagnosis not present

## 2014-02-04 DIAGNOSIS — K589 Irritable bowel syndrome without diarrhea: Secondary | ICD-10-CM

## 2014-02-04 DIAGNOSIS — Z202 Contact with and (suspected) exposure to infections with a predominantly sexual mode of transmission: Secondary | ICD-10-CM | POA: Insufficient documentation

## 2014-02-04 NOTE — Progress Notes (Signed)
Subjective:    Patient ID: James Dodson., male    DOB: [birth year], 53 y.o.   MRN: [id number]  HPI  Pt in stating that he may have had some exposure to std about 12 days ago. He had some relations with person he does not know. He was drinking at time of this behaviour. He was wearing a condom and only hand manipulation. Pt states he had no sex with his wife in a year and half. States no sex with anyone than his wife in his entire life.  Pt was drinking a lot before this happened and recently since this promiscuous behavior he stopped drinking.  Mild upset stomach recently. No gerd symptoms. Recent with anxiety with above worries about std.  He  had mild loose stool intermittent with more gastric digestive sounds at times. I advised metamucil with 1 round tablespoon in 8 oz of water. He describes GI symtpoms coinciding with anxiety. No fever, no chills, no nausea or vomiting. No black or bloody stools. No chest or back pain.  Past Medical History  Diagnosis Date  . Cellulitis     toe  . Callus     left toe    History   Social History  . Marital Status: Married    Spouse Name: N/A    Number of Children: N/A  . Years of Education: N/A   Occupational History  . Not on file.   Social History Main Topics  . Smoking status: Never Smoker   . Smokeless tobacco: Never Used  . Alcohol Use: Yes  . Drug Use: No  . Sexual Activity: Not on file   Other Topics Concern  . Not on file   Social History Narrative   Self employed--makes furniture      Regular exercise:  Yes             Past Surgical History  Procedure Laterality Date  . Vasectomy    . Hand surgery      table saw accident    Family History  Problem Relation Age of Onset  . Diabetes Father   . Heart attack Paternal Grandfather     Allergies  Allergen Reactions  . Penicillins     His father had anaphylaxis; he's never taken PCN @ advice of Allergist  . Erythromycin     GI intolerance    Current  Outpatient Prescriptions on File Prior to Visit  Medication Sig Dispense Refill  . fexofenadine (ALLEGRA) 180 MG tablet Take 180 mg by mouth daily as needed.        . fluticasone (VERAMYST) 27.5 MCG/SPRAY nasal spray 2 spray into the nose daily  10 g  0  . sildenafil (VIAGRA) 100 MG tablet Take 0.5-1 tablets (50-100 mg total) by mouth daily as needed for erectile dysfunction.  10 tablet  11  . sulfamethoxazole-trimethoprim (BACTRIM DS) 800-160 MG per tablet Take 1 tablet by mouth 2 (two) times daily.  20 tablet  0   No current facility-administered medications on file prior to visit.    BP 135/83  Pulse 76  Temp(Src) 98.6 F (37 C) (Oral)  Ht 5\' 11"  (1.803 m)  Wt 226 lb (102.513 kg)  BMI 31.53 kg/m2  SpO2 96%     Review of Systems  Constitutional: Negative for fever, chills and fatigue.  HENT: Negative.   Respiratory: Negative for cough, chest tightness, shortness of breath and wheezing.   Cardiovascular: Negative for chest pain and palpitations.  Gastrointestinal: Negative.  Genitourinary: Negative.   Musculoskeletal: Negative for back pain, joint swelling, myalgias, neck pain and neck stiffness.       Objective:   Physical Exam  Constitutional: He is oriented to person, place, and time. He appears well-developed and well-nourished. No distress.  Neck: Normal range of motion. Neck supple. No JVD present. No tracheal deviation present. No thyromegaly present.  Cardiovascular: Normal rate, regular rhythm and normal heart sounds.   Pulmonary/Chest: Effort normal and breath sounds normal. No stridor. No respiratory distress. He has no wheezes. He exhibits no tenderness.  Abdominal: Soft. Bowel sounds are normal. He exhibits no distension and no mass. There is no tenderness. There is no rebound and no guarding.  Genitourinary:  Deferred since no genitourinary complaints and pt wearing condom at the time event  Lymphadenopathy:    He has no cervical adenopathy.  Neurological:  He is alert and oriented to person, place, and time. No cranial nerve deficit. Coordination normal.  Skin: He is not diaphoretic.  Psychiatric: He has a normal mood and affect. His behavior is normal. Judgment and thought content normal.  Very anxious about possible std although mechanism highly unlikely.          Assessment & Plan:

## 2014-02-04 NOTE — Assessment & Plan Note (Signed)
Recent anxiety with possible std followed by varying days of mild loose stools and occasional constipation. Advised conservative measure metamucil. If GI symptoms persist expand work up. So minimal symptoms today that I did not think work up needed today.

## 2014-02-04 NOTE — Assessment & Plan Note (Signed)
Remote possible. Pt is very concerned fo std and  will go ahead and do urine ancillary testing(gonnorhea, chlamydia, trichomonas), and get hiv with rpr.

## 2014-02-04 NOTE — Patient Instructions (Signed)
For your possible std exposure we are doing labs and urine studies.   For your mild on and off upset stomach with bouts of mild loose stools and constipation, I advise use metamucil 1 rounded tablespoon in 8 oz of water 3 x a day.(These symptoms may represent IBS since they seem to correlate with recent stress) If your stomach symptoms worsens or change notify us.  Follow up in 1-2 months for PE or as needed.

## 2014-02-05 LAB — HIV ANTIBODY (ROUTINE TESTING W REFLEX): HIV: NONREACTIVE

## 2014-02-05 LAB — RPR

## 2014-02-06 LAB — URINE CYTOLOGY ANCILLARY ONLY
Chlamydia: NEGATIVE
Neisseria Gonorrhea: NEGATIVE
TRICH (WINDOWPATH): NEGATIVE

## 2014-04-02 ENCOUNTER — Ambulatory Visit (INDEPENDENT_AMBULATORY_CARE_PROVIDER_SITE_OTHER): Payer: BC Managed Care – PPO | Admitting: Family Medicine

## 2014-04-02 ENCOUNTER — Encounter: Payer: Self-pay | Admitting: Family Medicine

## 2014-04-02 VITALS — BP 126/76 | HR 69 | Temp 98.2°F | Ht 71.0 in | Wt 235.8 lb

## 2014-04-02 DIAGNOSIS — Z0000 Encounter for general adult medical examination without abnormal findings: Secondary | ICD-10-CM

## 2014-04-02 LAB — POCT URINALYSIS DIPSTICK
Bilirubin, UA: NEGATIVE
Blood, UA: NEGATIVE
Glucose, UA: NEGATIVE
KETONES UA: NEGATIVE
LEUKOCYTES UA: NEGATIVE
NITRITE UA: NEGATIVE
PROTEIN UA: NEGATIVE
Spec Grav, UA: 1.015
UROBILINOGEN UA: 0.2
pH, UA: 6

## 2014-04-02 NOTE — Progress Notes (Signed)
Subjective:    Patient ID: James Holteronald Mills Jr., male    DOB: 10/25/1960, 53 y.o.   MRN: [id number]  HPI Pt here for cpe and labs.  C/o numbness in L toe since skiing a double shift over the weekend.    Past Medical History  Diagnosis Date  . Cellulitis     toe  . Callus     left toe   History   Social History  . Marital Status: Married    Spouse Name: N/A    Number of Children: N/A  . Years of Education: N/A   Occupational History  . Musicianfurniture builder      self employed  . ski instructor      [employer]   Social History Main Topics  . Smoking status: Never Smoker   . Smokeless tobacco: Never Used  . Alcohol Use: Yes  . Drug Use: No  . Sexual Activity: Yes   Other Topics Concern  . Not on file   Social History Narrative   Self employed--makes furniture      Regular exercise:  Yes            Family History  Problem Relation Age of Onset  . Diabetes Father   . Heart attack Paternal Grandfather    Current Outpatient Prescriptions  Medication Sig Dispense Refill  . fexofenadine (ALLEGRA) 180 MG tablet Take 180 mg by mouth daily as needed.      . sildenafil (VIAGRA) 100 MG tablet Take 0.5-1 tablets (50-100 mg total) by mouth daily as needed for erectile dysfunction. 10 tablet 11  . triamcinolone (NASACORT ALLERGY 24HR) 55 MCG/ACT AERO nasal inhaler Place 2 sprays into the nose daily.     No current facility-administered medications for this visit.       Review of Systems Review of Systems  Constitutional: Negative for activity change, appetite change and fatigue.  HENT: Negative for hearing loss, congestion, tinnitus and ear discharge.  dentist [id number] Eyes: Negative for visual disturbance (see optho q1y -- vision corrected to 20/20 with glasses).  Respiratory: Negative for cough, chest tightness and shortness of breath.   Cardiovascular: Negative for chest pain, palpitations and leg swelling.  Gastrointestinal: Negative for abdominal pain, diarrhea,  constipation and abdominal distention.  Genitourinary: Negative for urgency, frequency, decreased urine volume and difficulty urinating.  Musculoskeletal: Negative for back pain, arthralgias and gait problem.  Skin: Negative for color change, pallor and rash.  Neurological: Negative for dizziness, light-headedness, numbness and headaches.  Hematological: Negative for adenopathy. Does not bruise/bleed easily.  Psychiatric/Behavioral: Negative for suicidal ideas, confusion, sleep disturbance, self-injury, dysphoric mood, decreased concentration and agitation.          Objective:   Physical Exam  BP 126/76 mmHg  Pulse 69  Temp(Src) 98.2 F (36.8 C) (Oral)  Ht 5\' 11"  (1.803 m)  Wt 235 lb 12.8 oz (106.958 kg)  BMI 32.90 kg/m2  SpO2 95% General appearance: alert, cooperative, appears stated age and no distress Head: Normocephalic, without obvious abnormality, atraumatic Eyes: conjunctivae/corneas clear. PERRL, EOM's intact. Fundi benign. Ears: normal TM's and external ear canals both ears Nose: Nares normal. Septum midline. Mucosa normal. No drainage or sinus tenderness. Throat: lips, mucosa, and tongue normal; teeth and gums normal Neck: no adenopathy, no carotid bruit, no JVD, supple, symmetrical, trachea midline and thyroid not enlarged, symmetric, no tenderness/mass/nodules Back: symmetric, no curvature. ROM normal. No CVA tenderness. Lungs: clear to auscultation bilaterally Chest wall: no tenderness Heart: regular rate and rhythm, S1,  S2 normal, no murmur, click, rub or gallop Abdomen: soft, non-tender; bowel sounds normal; no masses,  no organomegaly Male genitalia: normal Rectal: normal tone, normal prostate, no masses or tenderness and soft brown guaiac negative stool noted Extremities: extremities normal, atraumatic, no cyanosis or edema Pulses: 2+ and symmetric Skin: Skin color, texture, turgor normal. No rashes or lesions Lymph nodes: Cervical, supraclavicular, and  axillary nodes normal. Neurologic: Alert and oriented X 3, normal strength and tone. Normal symmetric reflexes. Normal coordination and gait Psych- no depression, no anxiety       Assessment & Plan:  1. Preventative health care ghm utd Check labs See AVS - Basic metabolic panel - CBC with Differential - Hepatic function panel - Lipid panel - POCT urinalysis dipstick - PSA - TSH - Ambulatory referral to Gastroenterology

## 2014-04-02 NOTE — Progress Notes (Signed)
Pre visit review using our clinic review tool, if applicable. No additional management support is needed unless otherwise documented below in the visit note. 

## 2014-04-02 NOTE — Patient Instructions (Signed)
Preventive Care for Adults A healthy lifestyle and preventive care can promote health and wellness. Preventive health guidelines for men include the following key practices:  A routine yearly physical is a good way to check with your health care provider about your health and preventative screening. It is a chance to share any concerns and updates on your health and to receive a thorough exam.  Visit your dentist for a routine exam and preventative care every 6 months. Brush your teeth twice a day and floss once a day. Good oral hygiene prevents tooth decay and gum disease.  The frequency of eye exams is based on your age, health, family medical history, use of contact lenses, and other factors. Follow your health care provider's recommendations for frequency of eye exams.  Eat a healthy diet. Foods such as vegetables, fruits, whole grains, low-fat dairy products, and lean protein foods contain the nutrients you need without too many calories. Decrease your intake of foods high in solid fats, added sugars, and salt. Eat the right amount of calories for you.Get information about a proper diet from your health care provider, if necessary.  Regular physical exercise is one of the most important things you can do for your health. Most adults should get at least 150 minutes of moderate-intensity exercise (any activity that increases your heart rate and causes you to sweat) each week. In addition, most adults need muscle-strengthening exercises on 2 or more days a week.  Maintain a healthy weight. The body mass index (BMI) is a screening tool to identify possible weight problems. It provides an estimate of body fat based on height and weight. Your health care provider can find your BMI and can help you achieve or maintain a healthy weight.For adults 20 years and older:  A BMI below 18.5 is considered underweight.  A BMI of 18.5 to 24.9 is normal.  A BMI of 25 to 29.9 is considered overweight.  A BMI  of 30 and above is considered obese.  Maintain normal blood lipids and cholesterol levels by exercising and minimizing your intake of saturated fat. Eat a balanced diet with plenty of fruit and vegetables. Blood tests for lipids and cholesterol should begin at age 50 and be repeated every 5 years. If your lipid or cholesterol levels are high, you are over 50, or you are at high risk for heart disease, you may need your cholesterol levels checked more frequently.Ongoing high lipid and cholesterol levels should be treated with medicines if diet and exercise are not working.  If you smoke, find out from your health care provider how to quit. If you do not use tobacco, do not start.  Lung cancer screening is recommended for adults aged 73-80 years who are at high risk for developing lung cancer because of a history of smoking. A yearly low-dose CT scan of the lungs is recommended for people who have at least a 30-pack-year history of smoking and are a current smoker or have quit within the past 15 years. A pack year of smoking is smoking an average of 1 pack of cigarettes a day for 1 year (for example: 1 pack a day for 30 years or 2 packs a day for 15 years). Yearly screening should continue until the smoker has stopped smoking for at least 15 years. Yearly screening should be stopped for people who develop a health problem that would prevent them from having lung cancer treatment.  If you choose to drink alcohol, do not have more than  2 drinks per day. One drink is considered to be 12 ounces (355 mL) of beer, 5 ounces (148 mL) of wine, or 1.5 ounces (44 mL) of liquor.  Avoid use of street drugs. Do not share needles with anyone. Ask for help if you need support or instructions about stopping the use of drugs.  High blood pressure causes heart disease and increases the risk of stroke. Your blood pressure should be checked at least every 1-2 years. Ongoing high blood pressure should be treated with  medicines, if weight loss and exercise are not effective.  If you are 45-79 years old, ask your health care provider if you should take aspirin to prevent heart disease.  Diabetes screening involves taking a blood sample to check your fasting blood sugar level. This should be done once every 3 years, after age 45, if you are within normal weight and without risk factors for diabetes. Testing should be considered at a younger age or be carried out more frequently if you are overweight and have at least 1 risk factor for diabetes.  Colorectal cancer can be detected and often prevented. Most routine colorectal cancer screening begins at the age of 50 and continues through age 75. However, your health care provider may recommend screening at an earlier age if you have risk factors for colon cancer. On a yearly basis, your health care provider may provide home test kits to check for hidden blood in the stool. Use of a small camera at the end of a tube to directly examine the colon (sigmoidoscopy or colonoscopy) can detect the earliest forms of colorectal cancer. Talk to your health care provider about this at age 50, when routine screening begins. Direct exam of the colon should be repeated every 5-10 years through age 75, unless early forms of precancerous polyps or small growths are found.  People who are at an increased risk for hepatitis B should be screened for this virus. You are considered at high risk for hepatitis B if:  You were born in a country where hepatitis B occurs often. Talk with your health care provider about which countries are considered high risk.  Your parents were born in a high-risk country and you have not received a shot to protect against hepatitis B (hepatitis B vaccine).  You have HIV or AIDS.  You use needles to inject street drugs.  You live with, or have sex with, someone who has hepatitis B.  You are a man who has sex with other men (MSM).  You get hemodialysis  treatment.  You take certain medicines for conditions such as cancer, organ transplantation, and autoimmune conditions.  Hepatitis C blood testing is recommended for all people born from 1945 through 1965 and any individual with known risks for hepatitis C.  Practice safe sex. Use condoms and avoid high-risk sexual practices to reduce the spread of sexually transmitted infections (STIs). STIs include gonorrhea, chlamydia, syphilis, trichomonas, herpes, HPV, and human immunodeficiency virus (HIV). Herpes, HIV, and HPV are viral illnesses that have no cure. They can result in disability, cancer, and death.  If you are at risk of being infected with HIV, it is recommended that you take a prescription medicine daily to prevent HIV infection. This is called preexposure prophylaxis (PrEP). You are considered at risk if:  You are a man who has sex with other men (MSM) and have other risk factors.  You are a heterosexual man, are sexually active, and are at increased risk for HIV infection.    You take drugs by injection.  You are sexually active with a partner who has HIV.  Talk with your health care provider about whether you are at high risk of being infected with HIV. If you choose to begin PrEP, you should first be tested for HIV. You should then be tested every 3 months for as long as you are taking PrEP.  A one-time screening for abdominal aortic aneurysm (AAA) and surgical repair of large AAAs by ultrasound are recommended for men ages 32 to 67 years who are current or former smokers.  Healthy men should no longer receive prostate-specific antigen (PSA) blood tests as part of routine cancer screening. Talk with your health care provider about prostate cancer screening.  Testicular cancer screening is not recommended for adult males who have no symptoms. Screening includes self-exam, a health care provider exam, and other screening tests. Consult with your health care provider about any symptoms  you have or any concerns you have about testicular cancer.  Use sunscreen. Apply sunscreen liberally and repeatedly throughout the day. You should seek shade when your shadow is shorter than you. Protect yourself by wearing long sleeves, pants, a wide-brimmed hat, and sunglasses year round, whenever you are outdoors.  Once a month, do a whole-body skin exam, using a mirror to look at the skin on your back. Tell your health care provider about new moles, moles that have irregular borders, moles that are larger than a pencil eraser, or moles that have changed in shape or color.  Stay current with required vaccines (immunizations).  Influenza vaccine. All adults should be immunized every year.  Tetanus, diphtheria, and acellular pertussis (Td, Tdap) vaccine. An adult who has not previously received Tdap or who does not know his vaccine status should receive 1 dose of Tdap. This initial dose should be followed by tetanus and diphtheria toxoids (Td) booster doses every 10 years. Adults with an unknown or incomplete history of completing a 3-dose immunization series with Td-containing vaccines should begin or complete a primary immunization series including a Tdap dose. Adults should receive a Td booster every 10 years.  Varicella vaccine. An adult without evidence of immunity to varicella should receive 2 doses or a second dose if he has previously received 1 dose.  Human papillomavirus (HPV) vaccine. Males aged 68-21 years who have not received the vaccine previously should receive the 3-dose series. Males aged 22-26 years may be immunized. Immunization is recommended through the age of 6 years for any male who has sex with males and did not get any or all doses earlier. Immunization is recommended for any person with an immunocompromised condition through the age of 49 years if he did not get any or all doses earlier. During the 3-dose series, the second dose should be obtained 4-8 weeks after the first  dose. The third dose should be obtained 24 weeks after the first dose and 16 weeks after the second dose.  Zoster vaccine. One dose is recommended for adults aged 50 years or older unless certain conditions are present.  Measles, mumps, and rubella (MMR) vaccine. Adults born before [birth year] generally are considered immune to measles and mumps. Adults born in [birth year] or later should have 1 or more doses of MMR vaccine unless there is a contraindication to the vaccine or there is laboratory evidence of immunity to each of the three diseases. A routine second dose of MMR vaccine should be obtained at least 28 days after the first dose for students attending postsecondary  schools, health care workers, or international travelers. People who received inactivated measles vaccine or an unknown type of measles vaccine during 1963-1967 should receive 2 doses of MMR vaccine. People who received inactivated mumps vaccine or an unknown type of mumps vaccine before 1979 and are at high risk for mumps infection should consider immunization with 2 doses of MMR vaccine. Unvaccinated health care workers born before 1957 who lack laboratory evidence of measles, mumps, or rubella immunity or laboratory confirmation of disease should consider measles and mumps immunization with 2 doses of MMR vaccine or rubella immunization with 1 dose of MMR vaccine.  Pneumococcal 13-valent conjugate (PCV13) vaccine. When indicated, a person who is uncertain of his immunization history and has no record of immunization should receive the PCV13 vaccine. An adult aged 19 years or older who has certain medical conditions and has not been previously immunized should receive 1 dose of PCV13 vaccine. This PCV13 should be followed with a dose of pneumococcal polysaccharide (PPSV23) vaccine. The PPSV23 vaccine dose should be obtained at least 8 weeks after the dose of PCV13 vaccine. An adult aged 19 years or older who has certain medical conditions and  previously received 1 or more doses of PPSV23 vaccine should receive 1 dose of PCV13. The PCV13 vaccine dose should be obtained 1 or more years after the last PPSV23 vaccine dose.  Pneumococcal polysaccharide (PPSV23) vaccine. When PCV13 is also indicated, PCV13 should be obtained first. All adults aged 65 years and older should be immunized. An adult younger than age 65 years who has certain medical conditions should be immunized. Any person who resides in a nursing home or long-term care facility should be immunized. An adult smoker should be immunized. People with an immunocompromised condition and certain other conditions should receive both PCV13 and PPSV23 vaccines. People with human immunodeficiency virus (HIV) infection should be immunized as soon as possible after diagnosis. Immunization during chemotherapy or radiation therapy should be avoided. Routine use of PPSV23 vaccine is not recommended for American Indians, [place] Natives, or people younger than 65 years unless there are medical conditions that require PPSV23 vaccine. When indicated, people who have unknown immunization and have no record of immunization should receive PPSV23 vaccine. One-time revaccination 5 years after the first dose of PPSV23 is recommended for people aged 19-64 years who have chronic kidney failure, nephrotic syndrome, asplenia, or immunocompromised conditions. People who received 1-2 doses of PPSV23 before age 65 years should receive another dose of PPSV23 vaccine at age 65 years or later if at least 5 years have passed since the previous dose. Doses of PPSV23 are not needed for people immunized with PPSV23 at or after age 65 years.  Meningococcal vaccine. Adults with asplenia or persistent complement component deficiencies should receive 2 doses of quadrivalent meningococcal conjugate (MenACWY-D) vaccine. The doses should be obtained at least 2 months apart. Microbiologists working with certain meningococcal bacteria,  military recruits, people at risk during an outbreak, and people who travel to or live in countries with a high rate of meningitis should be immunized. A first-year college student up through age 21 years who is living in a residence hall should receive a dose if he did not receive a dose on or after his 16th birthday. Adults who have certain high-risk conditions should receive one or more doses of vaccine.  Hepatitis A vaccine. Adults who wish to be protected from this disease, have certain high-risk conditions, work with hepatitis A-infected animals, work in hepatitis A research labs, or   travel to or work in countries with a high rate of hepatitis A should be immunized. Adults who were previously unvaccinated and who anticipate close contact with an international adoptee during the first 60 days after arrival in the [place] from a country with a high rate of hepatitis A should be immunized.  Hepatitis B vaccine. Adults should be immunized if they wish to be protected from this disease, have certain high-risk conditions, may be exposed to blood or other infectious body fluids, are household contacts or sex partners of hepatitis B positive people, are clients or workers in certain care facilities, or travel to or work in countries with a high rate of hepatitis B.  Haemophilus influenzae type b (Hib) vaccine. A previously unvaccinated person with asplenia or sickle cell disease or having a scheduled splenectomy should receive 1 dose of Hib vaccine. Regardless of previous immunization, a recipient of a hematopoietic stem cell transplant should receive a 3-dose series 6-12 months after his successful transplant. Hib vaccine is not recommended for adults with HIV infection. Preventive Service / Frequency Ages 52 to 17  Blood pressure check.** / Every 1 to 2 years.  Lipid and cholesterol check.** / Every 5 years beginning at age 69.  Hepatitis C blood test.** / For any individual with known risks for  hepatitis C.  Skin self-exam. / Monthly.  Influenza vaccine. / Every year.  Tetanus, diphtheria, and acellular pertussis (Tdap, Td) vaccine.** / Consult your health care provider. 1 dose of Td every 10 years.  Varicella vaccine.** / Consult your health care provider.  HPV vaccine. / 3 doses over 6 months, if 72 or younger.  Measles, mumps, rubella (MMR) vaccine.** / You need at least 1 dose of MMR if you were born in 1957 or later. You may also need a second dose.  Pneumococcal 13-valent conjugate (PCV13) vaccine.** / Consult your health care provider.  Pneumococcal polysaccharide (PPSV23) vaccine.** / 1 to 2 doses if you smoke cigarettes or if you have certain conditions.  Meningococcal vaccine.** / 1 dose if you are age 35 to 60 years and a Market researcher living in a residence hall, or have one of several medical conditions. You may also need additional booster doses.  Hepatitis A vaccine.** / Consult your health care provider.  Hepatitis B vaccine.** / Consult your health care provider.  Haemophilus influenzae type b (Hib) vaccine.** / Consult your health care provider. Ages 35 to 8  Blood pressure check.** / Every 1 to 2 years.  Lipid and cholesterol check.** / Every 5 years beginning at age 57.  Lung cancer screening. / Every year if you are aged 44-80 years and have a 30-pack-year history of smoking and currently smoke or have quit within the past 15 years. Yearly screening is stopped once you have quit smoking for at least 15 years or develop a health problem that would prevent you from having lung cancer treatment.  Fecal occult blood test (FOBT) of stool. / Every year beginning at age 55 and continuing until age 73. You may not have to do this test if you get a colonoscopy every 10 years.  Flexible sigmoidoscopy** or colonoscopy.** / Every 5 years for a flexible sigmoidoscopy or every 10 years for a colonoscopy beginning at age 28 and continuing until age  1.  Hepatitis C blood test.** / For all people born from [birth year] through 1965 and any individual with known risks for hepatitis C.  Skin self-exam. / Monthly.  Influenza vaccine. / Every  year.  Tetanus, diphtheria, and acellular pertussis (Tdap/Td) vaccine.** / Consult your health care provider. 1 dose of Td every 10 years.  Varicella vaccine.** / Consult your health care provider.  Zoster vaccine.** / 1 dose for adults aged 53 years or older.  Measles, mumps, rubella (MMR) vaccine.** / You need at least 1 dose of MMR if you were born in 1957 or later. You may also need a second dose.  Pneumococcal 13-valent conjugate (PCV13) vaccine.** / Consult your health care provider.  Pneumococcal polysaccharide (PPSV23) vaccine.** / 1 to 2 doses if you smoke cigarettes or if you have certain conditions.  Meningococcal vaccine.** / Consult your health care provider.  Hepatitis A vaccine.** / Consult your health care provider.  Hepatitis B vaccine.** / Consult your health care provider.  Haemophilus influenzae type b (Hib) vaccine.** / Consult your health care provider. Ages 77 and over  Blood pressure check.** / Every 1 to 2 years.  Lipid and cholesterol check.**/ Every 5 years beginning at age 85.  Lung cancer screening. / Every year if you are aged 55-80 years and have a 30-pack-year history of smoking and currently smoke or have quit within the past 15 years. Yearly screening is stopped once you have quit smoking for at least 15 years or develop a health problem that would prevent you from having lung cancer treatment.  Fecal occult blood test (FOBT) of stool. / Every year beginning at age 33 and continuing until age 11. You may not have to do this test if you get a colonoscopy every 10 years.  Flexible sigmoidoscopy** or colonoscopy.** / Every 5 years for a flexible sigmoidoscopy or every 10 years for a colonoscopy beginning at age 28 and continuing until age 73.  Hepatitis C blood  test.** / For all people born from [birth year] through 1965 and any individual with known risks for hepatitis C.  Abdominal aortic aneurysm (AAA) screening.** / A one-time screening for ages 50 to 27 years who are current or former smokers.  Skin self-exam. / Monthly.  Influenza vaccine. / Every year.  Tetanus, diphtheria, and acellular pertussis (Tdap/Td) vaccine.** / 1 dose of Td every 10 years.  Varicella vaccine.** / Consult your health care provider.  Zoster vaccine.** / 1 dose for adults aged 34 years or older.  Pneumococcal 13-valent conjugate (PCV13) vaccine.** / Consult your health care provider.  Pneumococcal polysaccharide (PPSV23) vaccine.** / 1 dose for all adults aged 63 years and older.  Meningococcal vaccine.** / Consult your health care provider.  Hepatitis A vaccine.** / Consult your health care provider.  Hepatitis B vaccine.** / Consult your health care provider.  Haemophilus influenzae type b (Hib) vaccine.** / Consult your health care provider. **Family history and personal history of risk and conditions may change your health care provider's recommendations. Document Released: 06/15/2001 Document Revised: 04/24/2013 Document Reviewed: 09/14/2010 [facility] Patient Information 2015 [place], [place]. This information is not intended to replace advice given to you by your health care provider. Make sure you discuss any questions you have with your health care provider.

## 2014-04-03 LAB — HEPATIC FUNCTION PANEL
ALK PHOS: 58 U/L (ref 39–117)
ALT: 34 U/L (ref 0–53)
AST: 25 U/L (ref 0–37)
Albumin: 4.2 g/dL (ref 3.5–5.2)
BILIRUBIN TOTAL: 0.9 mg/dL (ref 0.2–1.2)
Bilirubin, Direct: 0.2 mg/dL (ref 0.0–0.3)
Total Protein: 6.7 g/dL (ref 6.0–8.3)

## 2014-04-03 LAB — CBC WITH DIFFERENTIAL/PLATELET
Basophils Absolute: 0 10*3/uL (ref 0.0–0.1)
Basophils Relative: 0.7 % (ref 0.0–3.0)
EOS PCT: 11.2 % — AB (ref 0.0–5.0)
Eosinophils Absolute: 0.7 10*3/uL (ref 0.0–0.7)
HEMATOCRIT: 45.2 % (ref 39.0–52.0)
HEMOGLOBIN: 15.3 g/dL (ref 13.0–17.0)
LYMPHS ABS: 1.7 10*3/uL (ref 0.7–4.0)
Lymphocytes Relative: 28.1 % (ref 12.0–46.0)
MCHC: 33.9 g/dL (ref 30.0–36.0)
MCV: 95.2 fl (ref 78.0–100.0)
MONOS PCT: 9.3 % (ref 3.0–12.0)
Monocytes Absolute: 0.6 10*3/uL (ref 0.1–1.0)
Neutro Abs: 3.1 10*3/uL (ref 1.4–7.7)
Neutrophils Relative %: 50.7 % (ref 43.0–77.0)
PLATELETS: 205 10*3/uL (ref 150.0–400.0)
RBC: 4.74 Mil/uL (ref 4.22–5.81)
RDW: 13.3 % (ref 11.5–15.5)
WBC: 6 10*3/uL (ref 4.0–10.5)

## 2014-04-03 LAB — BASIC METABOLIC PANEL
BUN: 12 mg/dL (ref 6–23)
CO2: 25 mEq/L (ref 19–32)
Calcium: 8.8 mg/dL (ref 8.4–10.5)
Chloride: 102 mEq/L (ref 96–112)
Creatinine, Ser: 0.9 mg/dL (ref 0.4–1.5)
GFR: 93.12 mL/min (ref >60.00)
Glucose, Bld: 85 mg/dL (ref 70–99)
POTASSIUM: 3.9 meq/L (ref 3.5–5.1)
SODIUM: 135 meq/L (ref 135–145)

## 2014-04-03 LAB — PSA: PSA: 0.6 ng/mL (ref 0.10–4.00)

## 2014-04-03 LAB — LIPID PANEL
CHOL/HDL RATIO: 3
CHOLESTEROL: 175 mg/dL (ref 0–200)
HDL: 55.2 mg/dL (ref >39.00)
LDL CALC: 107 mg/dL — AB (ref 0–99)
NONHDL: 119.8
Triglycerides: 62 mg/dL (ref 0.0–149.0)
VLDL: 12.4 mg/dL (ref 0.0–40.0)

## 2014-04-03 LAB — TSH: TSH: 3.16 u[IU]/mL (ref 0.35–4.50)

## 2015-02-13 ENCOUNTER — Telehealth: Payer: Self-pay | Admitting: Family Medicine

## 2015-02-13 NOTE — Telephone Encounter (Signed)
He can have them done at his CPE on 04/17/15.     KP

## 2015-02-13 NOTE — Telephone Encounter (Signed)
Pt states he was in last October for STD check. He said he was advised to repeat in 6 months to a year and was wanting to come in for labs. Does pt need appt with provider or just lab appt? Please advise.

## 2015-02-14 NOTE — Telephone Encounter (Signed)
Called to inform pt, his wife answered, asked her to have pt call.

## 2015-03-10 ENCOUNTER — Encounter: Payer: Self-pay | Admitting: Family

## 2015-03-10 ENCOUNTER — Ambulatory Visit (INDEPENDENT_AMBULATORY_CARE_PROVIDER_SITE_OTHER): Payer: No Typology Code available for payment source | Admitting: Family

## 2015-03-10 VITALS — BP 150/98 | HR 71 | Temp 98.0°F | Resp 16 | Ht 71.0 in | Wt 212.0 lb

## 2015-03-10 DIAGNOSIS — Z113 Encounter for screening for infections with a predominantly sexual mode of transmission: Secondary | ICD-10-CM | POA: Diagnosis not present

## 2015-03-10 DIAGNOSIS — R300 Dysuria: Secondary | ICD-10-CM

## 2015-03-10 DIAGNOSIS — F419 Anxiety disorder, unspecified: Secondary | ICD-10-CM | POA: Diagnosis not present

## 2015-03-10 DIAGNOSIS — F1010 Alcohol abuse, uncomplicated: Secondary | ICD-10-CM | POA: Diagnosis not present

## 2015-03-10 MED ORDER — ESCITALOPRAM OXALATE 10 MG PO TABS: ORAL_TABLET | ORAL | Status: DC

## 2015-03-10 NOTE — Assessment & Plan Note (Signed)
I am hoping that getting his anxiety under better control will help him remain off of alcohol.  We discussed not keeping alcohol in the house.  Discussed referral to AA (declines at this time). He is willing to meet with a counselor and I gave him a pamphlet and number to call.   30 min spent with pt today.  >50% of this time was spent counseling pt on anxiety, alcohol abuse.

## 2015-03-10 NOTE — Progress Notes (Signed)
Pre visit review using our clinic review tool, if applicable. No additional management support is needed unless otherwise documented below in the visit note. 

## 2015-03-10 NOTE — Patient Instructions (Signed)
Start lexapro 10mg , 1/2 tab by mouth once daily for 1 week.  Go to the ER if you develop thoughts of hurting yourself or others.   Complete lab work prior to leaving. Schedule an appointment with one of our therapists for help with anxiety and alcohol abuse.   [phone] Keep your upcoming appointment with Dr. Laury AxonLowne.

## 2015-03-10 NOTE — Progress Notes (Signed)
Subjective:    Patient ID: Ishmael Holter., male    DOB: 08-05-1960, 54 y.o.   MRN: [id number]  HPI  Mr. Doristine Locks is a 54 yr old male who presents today requesting testing for STD.  Pt reports that he has intermittent dysuria- none today. Reports+ pain after intercourse, though notes that he rarely has intercourse.  Had extramarital affair a year ago. Initial STD screening was negative but he wishes to repeat.   He also notes that he has had a lot of stress. He is caring for 2 elderly parents. Self employed.  Keeps a bottle of liquor in the refrigerator and often will drink throughout the day.  Notes that the alcohol helps keep him from worrying so much. Has felt fixated on possibility of STD.   Reports that a bottle of liquor will last him 1 week. He also drinks beer regularly. In the past when he has tried to stop drinking, he has experienced withdrawal symptoms (tremors). Though reports he has only had 4 drinks this week. Denies concerns for depression.    Review of Systems See HPI Past Medical History  Diagnosis Date  . Cellulitis     toe  . Callus     left toe    Social History   Social History  . Marital Status: Married    Spouse Name: N/A  . Number of Children: N/A  . Years of Education: N/A   Occupational History  . Musician      self employed  . ski instructor      [employer]   Social History Main Topics  . Smoking status: Never Smoker   . Smokeless tobacco: Never Used  . Alcohol Use: Yes  . Drug Use: No  . Sexual Activity: Yes   Other Topics Concern  . Not on file   Social History Narrative   Self employed--makes furniture      Regular exercise:  Yes             Past Surgical History  Procedure Laterality Date  . Vasectomy    . Hand surgery      table saw accident    Family History  Problem Relation Age of Onset  . Diabetes Father   . Heart attack Paternal Grandfather     Allergies  Allergen Reactions  . Eggs Or Egg-Derived  Products Anaphylaxis  . Penicillins     His father had anaphylaxis; he's never taken PCN @ advice of Allergist  . Erythromycin     GI intolerance    No current outpatient prescriptions on file prior to visit.   No current facility-administered medications on file prior to visit.    BP 150/98 mmHg  Pulse 71  Temp(Src) 98 F (36.7 C) (Oral)  Resp 16  Ht  (1.803 m)  Wt 212 lb (96.163 kg)  BMI 29.58 kg/m2  SpO2 99%        Objective:   Physical Exam  Constitutional: He is oriented to person, place, and time. He appears well-developed and well-nourished. No distress.  Genitourinary: Penis normal.  Neurological: He is alert and oriented to person, place, and time.  Psychiatric: He has a normal mood and affect. His behavior is normal. Judgment and thought content normal.  (chaperone present for exam)        Assessment & Plan:  STD screening- obtain labs as below.  BP is up today.  ? Related to anxiety- repeat at upcoming visit. BP Readings from  Last 3 Encounters:  03/10/15 150/98  04/02/14 126/76  02/04/14 135/83

## 2015-03-10 NOTE — Assessment & Plan Note (Signed)
New. Will rx with trial of lexapro.  Discussed common side effects. Start 1/2 tab once daily for 1 week, increase to a full tab once daily on week two.

## 2015-03-11 ENCOUNTER — Telehealth: Payer: Self-pay | Admitting: Family Medicine

## 2015-03-11 LAB — URINE CULTURE
Colony Count: NO GROWTH
Organism ID, Bacteria: NO GROWTH

## 2015-03-11 LAB — HSV 1 ANTIBODY, IGG: HSV 1 Glycoprotein G Ab, IgG: <0.1 IV

## 2015-03-11 LAB — HSV 2 ANTIBODY, IGG: HSV 2 GLYCOPROTEIN G AB, IGG: 0.24 IV

## 2015-03-11 LAB — HIV ANTIBODY (ROUTINE TESTING W REFLEX): HIV: NONREACTIVE

## 2015-03-11 LAB — RPR

## 2015-03-11 LAB — GC/CHLAMYDIA PROBE AMP, URINE
CHLAMYDIA, SWAB/URINE, PCR: NEGATIVE
GC PROBE AMP, URINE: NEGATIVE

## 2015-03-11 NOTE — Telephone Encounter (Signed)
Relation to WU:JWJXpt:self Call back number: [phone]   Reason for call:   Patient requesting lab results, patient is going out of town Saturday and will be OT for the entire week patient states if partial labs are back would like to know and also requesting if results can be expedited. Please advise

## 2015-03-12 LAB — HSV(HERPES SIMPLEX VRS) I + II AB-IGM: HERPES SIMPLEX VRS I-IGM AB (EIA): 1.23 {index} — AB

## 2015-03-12 NOTE — Telephone Encounter (Signed)
I think that his anxiety is contributing to his symptoms and I hope that symptoms will improve in the coming weeks on the lexapro.  Nothing further to do at this time.

## 2015-03-12 NOTE — Telephone Encounter (Signed)
Notified pt and advised him to see how does until his appt with Dr Laury AxonLowne next month and get further recommendations from her if symptoms are not improved by that time. Pt voices understanding.

## 2015-03-12 NOTE — Telephone Encounter (Signed)
Notified pt that all STD testing was normal. Pt states he did not get a good nights sleep last night due to worry over the results and his continued symptoms. Pt has started Lexapro, does not have appt with counselor yet. Pt [place]. Pt wants to know what he should do about his continued symptoms. States he is going to try different style of underwear. Please advise?

## 2015-03-19 ENCOUNTER — Telehealth: Payer: Self-pay | Admitting: *Deleted

## 2015-03-19 NOTE — Telephone Encounter (Signed)
Pt called stating he has history of episodes of loose stools then rectum/anus becomes raw and usually goes away within 1-2 days. Pt states he had an indisecretion after drinking about 1 month ago and he has become very anxious. He states that he was having loose stools prior to leaving for vacation and rectum has felt raw/irritated since Monday. Reassured pt that STD testing earlier this month was negative. Pt wants to know what he should use OTC for his rectal irritation. Per verbal from PCP, okay to apply desetin externally. Left detailed message on pt's cell# per pt's request.

## 2015-03-24 ENCOUNTER — Telehealth: Payer: Self-pay | Admitting: Family Medicine

## 2015-03-24 NOTE — Telephone Encounter (Signed)
03/10/15 prescribed Lexapro by Efraim KaufmannMelissa, he is having a discoloration to his tongue. Please advise    KP

## 2015-03-24 NOTE — Telephone Encounter (Signed)
Caller name: Self   Can be reached: [phone]   Reason for call: Patient concerned about his tongue having a yellow tent this morning. Not sure if its the medication he is taking. Plse Avs

## 2015-03-24 NOTE — Telephone Encounter (Signed)
It is unusual for lexapro to cause tongue discoloration but not impossible. It is not dangerous.  I would recommend that he continue lexapro for now and follow up in about 2 weeks.

## 2015-03-24 NOTE — Telephone Encounter (Signed)
Notified pt. He states he feels tired. Feels ok in the mornings and about mid-day heart rate increases to 90-100 and stays there. Wants to know what could be causing that. Advised him could be stress / anxiety but we could bring him in for evaluation tomorrow. Currently under a lot of stress in personal life.  Feels very tired and is having to make himself go. Pt still only taking 1/2 tablet of lexapro.  Advised him to increase to 1 full tablet as he states he has been tolerating medication.   Pt declines appt at this time, states he will increase lexapro and give it a few more days to see if heart rate improves.

## 2015-04-04 ENCOUNTER — Encounter: Payer: BC Managed Care – PPO | Admitting: Family Medicine

## 2015-04-21 ENCOUNTER — Ambulatory Visit (INDEPENDENT_AMBULATORY_CARE_PROVIDER_SITE_OTHER): Payer: No Typology Code available for payment source | Admitting: Family Medicine

## 2015-04-21 ENCOUNTER — Encounter: Payer: Self-pay | Admitting: Family Medicine

## 2015-04-21 VITALS — BP 140/100 | HR 72 | Temp 98.0°F | Ht 71.0 in | Wt 208.8 lb

## 2015-04-21 DIAGNOSIS — Z7251 High risk heterosexual behavior: Secondary | ICD-10-CM | POA: Diagnosis not present

## 2015-04-21 DIAGNOSIS — R030 Elevated blood-pressure reading, without diagnosis of hypertension: Secondary | ICD-10-CM

## 2015-04-21 DIAGNOSIS — Z0000 Encounter for general adult medical examination without abnormal findings: Secondary | ICD-10-CM | POA: Diagnosis not present

## 2015-04-21 DIAGNOSIS — IMO0001 Reserved for inherently not codable concepts without codable children: Secondary | ICD-10-CM

## 2015-04-21 DIAGNOSIS — F411 Generalized anxiety disorder: Secondary | ICD-10-CM

## 2015-04-21 DIAGNOSIS — F1010 Alcohol abuse, uncomplicated: Secondary | ICD-10-CM | POA: Diagnosis not present

## 2015-04-21 DIAGNOSIS — K625 Hemorrhage of anus and rectum: Secondary | ICD-10-CM

## 2015-04-21 DIAGNOSIS — Z1159 Encounter for screening for other viral diseases: Secondary | ICD-10-CM

## 2015-04-21 LAB — PSA: PSA: 0.84 ng/mL (ref 0.10–4.00)

## 2015-04-21 LAB — COMPREHENSIVE METABOLIC PANEL
ALBUMIN: 4.1 g/dL (ref 3.5–5.2)
ALK PHOS: 44 U/L (ref 39–117)
ALT: 18 U/L (ref 0–53)
AST: 18 U/L (ref 0–37)
BUN: 15 mg/dL (ref 6–23)
CALCIUM: 9 mg/dL (ref 8.4–10.5)
CHLORIDE: 107 meq/L (ref 96–112)
CO2: 25 meq/L (ref 19–32)
Creatinine, Ser: 0.9 mg/dL (ref 0.40–1.50)
GFR: 93.93 mL/min (ref >60.00)
Glucose, Bld: 124 mg/dL — ABNORMAL HIGH (ref 70–99)
POTASSIUM: 4.1 meq/L (ref 3.5–5.1)
SODIUM: 140 meq/L (ref 135–145)
Total Bilirubin: 0.8 mg/dL (ref 0.2–1.2)
Total Protein: 6.6 g/dL (ref 6.0–8.3)

## 2015-04-21 LAB — LIPID PANEL
CHOLESTEROL: 190 mg/dL (ref 0–200)
HDL: 77.9 mg/dL (ref >39.00)
LDL Cholesterol: 104 mg/dL — ABNORMAL HIGH (ref 0–99)
NONHDL: 111.74
Total CHOL/HDL Ratio: 2
Triglycerides: 39 mg/dL (ref 0.0–149.0)
VLDL: 7.8 mg/dL (ref 0.0–40.0)

## 2015-04-21 LAB — POCT URINALYSIS DIPSTICK
Bilirubin, UA: NEGATIVE
Blood, UA: NEGATIVE
Glucose, UA: NEGATIVE
Ketones, UA: NEGATIVE
Leukocytes, UA: NEGATIVE
Nitrite, UA: NEGATIVE
PH UA: 7.5
PROTEIN UA: NEGATIVE
Spec Grav, UA: 1.02
UROBILINOGEN UA: 0.2

## 2015-04-21 LAB — CBC WITH DIFFERENTIAL/PLATELET
BASOS PCT: 0.7 % (ref 0.0–3.0)
Basophils Absolute: 0 10*3/uL (ref 0.0–0.1)
EOS PCT: 6.5 % — AB (ref 0.0–5.0)
Eosinophils Absolute: 0.3 10*3/uL (ref 0.0–0.7)
HCT: 46.4 % (ref 39.0–52.0)
Hemoglobin: 15.8 g/dL (ref 13.0–17.0)
LYMPHS ABS: 1.4 10*3/uL (ref 0.7–4.0)
Lymphocytes Relative: 26.8 % (ref 12.0–46.0)
MCHC: 34.1 g/dL (ref 30.0–36.0)
MCV: 96 fl (ref 78.0–100.0)
MONO ABS: 0.4 10*3/uL (ref 0.1–1.0)
Monocytes Relative: 8.4 % (ref 3.0–12.0)
NEUTROS ABS: 3 10*3/uL (ref 1.4–7.7)
NEUTROS PCT: 57.6 % (ref 43.0–77.0)
PLATELETS: 215 10*3/uL (ref 150.0–400.0)
RBC: 4.84 Mil/uL (ref 4.22–5.81)
RDW: 13 % (ref 11.5–15.5)
WBC: 5.2 10*3/uL (ref 4.0–10.5)

## 2015-04-21 LAB — HEPATITIS C ANTIBODY: HCV AB: NEGATIVE

## 2015-04-21 LAB — TSH: TSH: 1.58 u[IU]/mL (ref 0.35–4.50)

## 2015-04-21 MED ORDER — ESCITALOPRAM OXALATE 20 MG PO TABS: 20.0000 mg | ORAL_TABLET | Freq: Every day | ORAL | Status: DC

## 2015-04-21 MED ORDER — OMEPRAZOLE 20 MG PO CPDR: 20.0000 mg | DELAYED_RELEASE_CAPSULE | Freq: Every day | ORAL | Status: DC

## 2015-04-21 NOTE — Addendum Note (Signed)
Addended by: Arnette NorrisPAYNE, Olga Seyler P on: 04/21/2015 09:48 AM   Modules accepted: Orders

## 2015-04-21 NOTE — Progress Notes (Signed)
Pre visit review using our clinic review tool, if applicable. No additional management support is needed unless otherwise documented below in the visit note. 

## 2015-04-21 NOTE — Patient Instructions (Signed)

## 2015-04-21 NOTE — Assessment & Plan Note (Signed)
May be due to anxiety Recheck 2 weeks

## 2015-04-21 NOTE — Progress Notes (Signed)
Subjective:    Patient ID: James Dodson., male    DOB: [birth year], 54 y.o.   MRN: [id number]  HPI    Review of Systems     Objective:   Physical Exam        Assessment & Plan:   Patient ID: James Dodson., male    DOB: Nov 03, 1960  Age: 54 y.o. MRN: [id number]    Subjective:  Subjective HPI James Dodson. presents for cpe.  Pt has fixed things with his wife.  He is still anxious about the HSV.    Review of Systems Review of Systems  Constitutional: Negative for activity change, appetite change and fatigue.  HENT: Negative for hearing loss, congestion, tinnitus and ear discharge.  dentist q75mEyes: Negative for visual disturbance (see optho q1y -- vision corrected to 20/20 with glasses).  Respiratory: Negative for cough, chest tightness and shortness of breath.   Cardiovascular: Negative for chest pain, palpitations and leg swelling.  Gastrointestinal: Negative for abdominal pain, diarrhea, constipation and abdominal distention.  Genitourinary: Negative for urgency, frequency, decreased urine volume and difficulty urinating.  Musculoskeletal: Negative for back pain, arthralgias and gait problem.  Skin: Negative for color change, pallor and rash.  Neurological: Negative for dizziness, light-headedness, numbness and headaches.  Hematological: Negative for adenopathy. Does not bruise/bleed easily.  Psychiatric/Behavioral: Negative for suicidal ideas, confusion, sleep disturbance, self-injury, dysphoric mood, decreased concentration and agitation.      History Past Medical History  Diagnosis Date  . Cellulitis     toe  . Callus     left toe    He has past surgical history that includes Vasectomy and Hand surgery.   His family history includes Diabetes in his father; Heart attack in his paternal grandfather.He reports that he has never smoked. He has never used smokeless tobacco. He reports that he drinks about 3.6 oz of alcohol per week. He reports that  he does not use illicit drugs.  No current outpatient prescriptions on file prior to visit.   No current facility-administered medications on file prior to visit.     Objective:  Objective Physical Exam BP 140/100 mmHg  Pulse 72  Temp(Src) 98 F (36.7 C) (Oral)  Ht '5\' 11"'$  (1.803 m)  Wt 208 lb 12.8 oz (94.711 kg)  BMI 29.13 kg/m2  SpO2 98% Wt Readings from Last 3 Encounters:  04/21/15 208 lb 12.8 oz (94.711 kg)  03/10/15 212 lb (96.163 kg)  04/02/14 235 lb 12.8 oz (106.958 kg)   BP 140/100 mmHg  Pulse 72  Temp(Src) 98 F (36.7 C) (Oral)  Ht '5\' 11"'$  (1.803 m)  Wt 208 lb 12.8 oz (94.711 kg)  BMI 29.13 kg/m2  SpO2 98% General appearance: alert, cooperative, appears stated age and no distress Head: Normocephalic, without obvious abnormality, atraumatic Eyes: conjunctivae/corneas clear. PERRL, EOM's intact. Fundi benign. Ears: normal TM's and external ear canals both ears Nose: Nares normal. Septum midline. Mucosa normal. No drainage or sinus tenderness. Throat: lips, mucosa, and tongue normal; teeth and gums normal Neck: no adenopathy, no carotid bruit, no JVD, supple, symmetrical, trachea midline and thyroid not enlarged, symmetric, no tenderness/mass/nodules Back: symmetric, no curvature. ROM normal. No CVA tenderness. Lungs: clear to auscultation bilaterally Chest wall: no tenderness Heart: regular rate and rhythm, S1, S2 normal, no murmur, click, rub or gallop Abdomen: soft, non-tender; bowel sounds normal; no masses,  no organomegaly Male genitalia: normal, penis: no lesions or discharge. testes: no masses or tenderness. no hernias  genprobe done---no lesions visualized Rectal: normal tone, normal prostate, no masses or tenderness and heme + brown stool Extremities: extremities normal, atraumatic, no cyanosis or edema Pulses: 2+ and symmetric Skin: Skin color, texture, turgor normal. No rashes or lesions Lymph nodes: Cervical, supraclavicular, and  axillary nodes normal. Neurologic: Alert and oriented X 3, normal strength and tone. Normal symmetric reflexes. Normal coordination and gait Psych- no depression,  + very anxious  Lab Results  Component Value Date   WBC 6.0 04/02/2014   HGB 15.3 04/02/2014   HCT 45.2 04/02/2014   PLT 205.0 04/02/2014   GLUCOSE 85 04/02/2014   CHOL 175 04/02/2014   TRIG 62.0 04/02/2014   HDL 55.20 04/02/2014   LDLCALC 107* 04/02/2014   ALT 34 04/02/2014   AST 25 04/02/2014   NA 135 04/02/2014   K 3.9 04/02/2014   CL 102 04/02/2014   CREATININE 0.9 04/02/2014   BUN 12 04/02/2014   CO2 25 04/02/2014   TSH 3.16 04/02/2014   PSA 0.60 04/02/2014    No results found.   Assessment & Plan:  Plan I have discontinued Mr. Geiger escitalopram. I am also having him start on escitalopram and omeprazole.  Meds ordered this encounter  Medications  . escitalopram (LEXAPRO) 20 MG tablet    Sig: Take 1 tablet (20 mg total) by mouth daily.    Dispense:  90 tablet    Refill:  3  . omeprazole (PRILOSEC) 20 MG capsule    Sig: Take 1 capsule (20 mg total) by mouth daily.    Dispense:  30 capsule    Refill:  3    Problem List Items Addressed This Visit    Preventative health care - Primary    Check labs ghm -- needs colon See AVS      Relevant Orders   CBC with Differential/Platelet   Lipid panel   POCT urinalysis dipstick   TSH   PSA   Comp Met (CMET)   Elevated BP    May be due to anxiety Recheck 2 weeks      Alcohol abuse    Other Visit Diagnoses    High risk sexual behavior        Relevant Orders    HSV(herpes simplex vrs) 1+2 ab-IgG    Generalized anxiety disorder        Relevant Medications    escitalopram (LEXAPRO) 20 MG tablet    Rectal bleeding        Relevant Medications    omeprazole (PRILOSEC) 20 MG capsule    Other Relevant Orders    Ambulatory referral to Gastroenterology       Follow-up: Return in about 2 weeks (around 05/05/2015), or if symptoms worsen or fail to  improve, for bp check, hypertension. anxiety.  James Koyanagi, DO     Patient ID: James Dodson., male   DOB: [birth year], 54 y.o.   MRN: [id number]

## 2015-04-21 NOTE — Assessment & Plan Note (Signed)
Check labs ghm -- needs colon See AVS

## 2015-04-22 LAB — HSV(HERPES SIMPLEX VRS) I + II AB-IGG: HSV 2 GLYCOPROTEIN G AB, IGG: 0.15 IV

## 2015-04-22 LAB — GC/CHLAMYDIA PROBE AMP
CT Probe RNA: NOT DETECTED
GC PROBE AMP APTIMA: NOT DETECTED

## 2015-05-06 ENCOUNTER — Ambulatory Visit (INDEPENDENT_AMBULATORY_CARE_PROVIDER_SITE_OTHER): Payer: BC Managed Care – PPO | Admitting: Family Medicine

## 2015-05-06 ENCOUNTER — Encounter: Payer: Self-pay | Admitting: Family Medicine

## 2015-05-06 VITALS — BP 130/72 | HR 71 | Temp 98.4°F | Ht 71.0 in | Wt 209.4 lb

## 2015-05-06 DIAGNOSIS — IMO0001 Reserved for inherently not codable concepts without codable children: Secondary | ICD-10-CM

## 2015-05-06 DIAGNOSIS — R030 Elevated blood-pressure reading, without diagnosis of hypertension: Secondary | ICD-10-CM

## 2015-05-06 DIAGNOSIS — R739 Hyperglycemia, unspecified: Secondary | ICD-10-CM | POA: Diagnosis not present

## 2015-05-06 DIAGNOSIS — E785 Hyperlipidemia, unspecified: Secondary | ICD-10-CM | POA: Diagnosis not present

## 2015-05-06 DIAGNOSIS — J019 Acute sinusitis, unspecified: Secondary | ICD-10-CM | POA: Insufficient documentation

## 2015-05-06 DIAGNOSIS — J0110 Acute frontal sinusitis, unspecified: Secondary | ICD-10-CM

## 2015-05-06 MED ORDER — CLARITHROMYCIN ER 500 MG PO TB24: 1000.0000 mg | ORAL_TABLET | Freq: Every day | ORAL | Status: DC

## 2015-05-06 NOTE — Progress Notes (Signed)
Patient ID: James Kopera., male    DOB: 05-Mar-1961  Age: 55 y.o. MRN: [id number]    Subjective:  Subjective HPI  James Somers. presents for f/u bp-- no cp, palp.  Pt also c/o congestion and sinus pressure.  It started with scratchy throat.   + low grade fever, body aches .  Symptoms started Friday at 3am.  + prod cough-- green mucous.     Review of Systems  Constitutional: Positive for fever and chills.  HENT: Positive for congestion, postnasal drip, sinus pressure and sore throat. Negative for rhinorrhea.   Respiratory: Positive for cough and chest tightness. Negative for shortness of breath and wheezing.   Cardiovascular: Negative for chest pain, palpitations and leg swelling.  Allergic/Immunologic: Negative for environmental allergies.    History Past Medical History  Diagnosis Date  . Cellulitis     toe  . Callus     left toe    He has past surgical history that includes Vasectomy and Hand surgery.   His family history includes Diabetes in his father; Heart attack in his paternal grandfather.He reports that he has never smoked. He has never used smokeless tobacco. He reports that he drinks about 3.6 oz of alcohol per week. He reports that he does not use illicit drugs.  Current Outpatient Prescriptions on File Prior to Visit  Medication Sig Dispense Refill  . escitalopram (LEXAPRO) 20 MG tablet Take 1 tablet (20 mg total) by mouth daily. 90 tablet 3   No current facility-administered medications on file prior to visit.     Objective:  Objective Physical Exam  Constitutional: He is oriented to person, place, and time. He appears well-developed and well-nourished.  HENT:  Right Ear: External ear normal.  Left Ear: External ear normal.  Nose: Rhinorrhea present. Right sinus exhibits maxillary sinus tenderness and frontal sinus tenderness. Left sinus exhibits maxillary sinus tenderness and frontal sinus tenderness.  Mouth/Throat: Posterior oropharyngeal erythema  present.  + PND + errythema  Eyes: Conjunctivae are normal. Right eye exhibits no discharge. Left eye exhibits no discharge.  Cardiovascular: Normal rate, regular rhythm and normal heart sounds.   No murmur heard. Pulmonary/Chest: Effort normal and breath sounds normal. No respiratory distress. He has no wheezes. He has no rales. He exhibits no tenderness.  Musculoskeletal: He exhibits no edema.  Lymphadenopathy:    He has cervical adenopathy.  Neurological: He is alert and oriented to person, place, and time.  Nursing note and vitals reviewed.  BP 130/72 mmHg  Pulse 71  Temp(Src) 98.4 F (36.9 C) (Oral)  Ht '5\' 11"'  (1.803 m)  Wt 209 lb 6.4 oz (94.983 kg)  BMI 29.22 kg/m2  SpO2 98% Wt Readings from Last 3 Encounters:  05/06/15 209 lb 6.4 oz (94.983 kg)  04/21/15 208 lb 12.8 oz (94.711 kg)  03/10/15 212 lb (96.163 kg)     Lab Results  Component Value Date   WBC 5.2 04/21/2015   HGB 15.8 04/21/2015   HCT 46.4 04/21/2015   PLT 215.0 04/21/2015   GLUCOSE 124* 04/21/2015   CHOL 190 04/21/2015   TRIG 39.0 04/21/2015   HDL 77.90 04/21/2015   LDLCALC 104* 04/21/2015   ALT 18 04/21/2015   AST 18 04/21/2015   NA 140 04/21/2015   K 4.1 04/21/2015   CL 107 04/21/2015   CREATININE 0.90 04/21/2015   BUN 15 04/21/2015   CO2 25 04/21/2015   TSH 1.58 04/21/2015   PSA 0.84 04/21/2015    No results found.  Assessment & Plan:  Plan I have discontinued Mr. Aderhold omeprazole. I am also having him start on clarithromycin. Additionally, I am having him maintain his escitalopram.  Meds ordered this encounter  Medications  . clarithromycin (BIAXIN XL) 500 MG 24 hr tablet    Sig: Take 2 tablets (1,000 mg total) by mouth daily.    Dispense:  28 tablet    Refill:  0    Problem List Items Addressed This Visit    Sinusitis, acute - Primary    abx per orders con't nasal spray and antihistamine rto prn      Relevant Medications   clarithromycin (BIAXIN XL) 500 MG 24 hr  tablet   Elevated BP    Better with anxiety decreasing  Will con't to monitor       Other Visit Diagnoses    Hyperglycemia        Relevant Orders    Hemoglobin A1c    Comp Met (CMET)    Hyperlipidemia        Relevant Orders    Lipid panel       Follow-up: Return in about 3 months (around 08/04/2015), or if symptoms worsen or fail to improve, for annual exam, fasting.  Garnet Koyanagi, DO

## 2015-05-06 NOTE — Assessment & Plan Note (Signed)
abx per orders con't nasal spray and antihistamine rto prn

## 2015-05-06 NOTE — Patient Instructions (Signed)

## 2015-05-06 NOTE — Assessment & Plan Note (Signed)
Better with anxiety decreasing  Will con't to monitor

## 2015-05-06 NOTE — Progress Notes (Signed)
Pre visit review using our clinic review tool, if applicable. No additional management support is needed unless otherwise documented below in the visit note. 

## 2015-08-04 ENCOUNTER — Other Ambulatory Visit: Payer: BC Managed Care – PPO

## 2016-08-03 ENCOUNTER — Telehealth: Payer: Self-pay | Admitting: Family Medicine

## 2016-08-03 NOTE — Telephone Encounter (Signed)
Caller name:  Relationship to patient: Self Can be reached: [phone]  Pharmacy:  Reason for call: Daughter is pregnant and patient has been advised to have a whooping cough vaccine. Needs to know if it contains egg

## 2016-08-05 NOTE — Telephone Encounter (Signed)
Called and scheduled him for a tdap 08/11/16

## 2016-08-05 NOTE — Telephone Encounter (Signed)
Last TD 2004

## 2016-08-05 NOTE — Telephone Encounter (Signed)
He can have tdap

## 2016-08-11 ENCOUNTER — Ambulatory Visit (INDEPENDENT_AMBULATORY_CARE_PROVIDER_SITE_OTHER): Payer: BC Managed Care – PPO | Admitting: Behavioral Health

## 2016-08-11 DIAGNOSIS — Z23 Encounter for immunization: Secondary | ICD-10-CM

## 2016-08-11 NOTE — Progress Notes (Signed)
Pre visit review using our clinic review tool, if applicable. No additional management support is needed unless otherwise documented below in the visit note.  Patient in clinic for Tdap vaccination. IM injection given in left deltoid. Patient tolerated injection well.

## 2018-07-17 ENCOUNTER — Other Ambulatory Visit: Payer: Self-pay

## 2018-07-17 ENCOUNTER — Ambulatory Visit: Payer: BC Managed Care – PPO | Admitting: Family Medicine

## 2018-07-17 VITALS — BP 123/92 | HR 73 | Temp 98.3°F | Resp 16 | Ht 71.0 in | Wt 242.2 lb

## 2018-07-17 DIAGNOSIS — J029 Acute pharyngitis, unspecified: Secondary | ICD-10-CM | POA: Diagnosis not present

## 2018-07-17 DIAGNOSIS — J020 Streptococcal pharyngitis: Secondary | ICD-10-CM

## 2018-07-17 DIAGNOSIS — R509 Fever, unspecified: Secondary | ICD-10-CM | POA: Diagnosis not present

## 2018-07-17 LAB — POCT RAPID STREP A (OFFICE): RAPID STREP A SCREEN: POSITIVE — AB

## 2018-07-17 LAB — POC INFLUENZA A&B (BINAX/QUICKVUE)
INFLUENZA B, POC: NEGATIVE
Influenza A, POC: NEGATIVE

## 2018-07-17 MED ORDER — AZITHROMYCIN 250 MG PO TABS: ORAL_TABLET | ORAL | 0 refills | Status: AC

## 2018-07-17 NOTE — Progress Notes (Signed)
po

## 2018-07-17 NOTE — Progress Notes (Signed)
Patient ID: James Dodson., male    DOB: 1960-07-09  Age: 58 y.o. MRN: [id number]    Subjective:  Subjective  HPI James Dodson. presents for sore throat, weakness , fever since Monday  He states he felt hot.  He did not take her temp.  + bodyaches   Review of Systems  Constitutional: Positive for chills and fever. Negative for appetite change, diaphoresis, fatigue and unexpected weight change.  HENT: Positive for congestion, postnasal drip, rhinorrhea and sore throat. Negative for sinus pressure.   Eyes: Negative for pain, redness and visual disturbance.  Respiratory: Negative for cough, chest tightness, shortness of breath and wheezing.   Cardiovascular: Negative for chest pain, palpitations and leg swelling.  Endocrine: Negative for cold intolerance, heat intolerance, polydipsia, polyphagia and polyuria.  Genitourinary: Negative for difficulty urinating, dysuria and frequency.  Musculoskeletal: Positive for myalgias.  Allergic/Immunologic: Negative for environmental allergies.  Neurological: Negative for dizziness, light-headedness, numbness and headaches.    History Past Medical History:  Diagnosis Date  . Callus    left toe  . Cellulitis    toe    He has a past surgical history that includes Vasectomy and Hand surgery.   His family history includes Diabetes in his father; Heart attack in his paternal grandfather.He reports that he has never smoked. He has never used smokeless tobacco. He reports current alcohol use of about 6.0 standard drinks of alcohol per week. He reports that he does not use drugs.  No current outpatient medications on file prior to visit.   No current facility-administered medications on file prior to visit.      Objective:  Objective  Physical Exam Vitals signs and nursing note reviewed.  Constitutional:      General: He is sleeping.     Appearance: He is well-developed.  HENT:     Head: Normocephalic and atraumatic.     Right Ear:  External ear normal.     Left Ear: External ear normal.     Mouth/Throat:     Pharynx: Pharyngeal swelling and posterior oropharyngeal erythema present.  Eyes:     General:        Right eye: No discharge.        Left eye: No discharge.     Conjunctiva/sclera: Conjunctivae normal.     Pupils: Pupils are equal, round, and reactive to light.  Neck:     Musculoskeletal: Normal range of motion and neck supple.     Thyroid: No thyromegaly.  Cardiovascular:     Rate and Rhythm: Normal rate and regular rhythm.     Heart sounds: Normal heart sounds. No murmur.  Pulmonary:     Effort: Pulmonary effort is normal. No respiratory distress.     Breath sounds: Normal breath sounds. No wheezing or rales.  Chest:     Chest wall: No tenderness.  Musculoskeletal:        General: No tenderness.  Lymphadenopathy:     Cervical: Cervical adenopathy present.  Skin:    General: Skin is warm and dry.  Neurological:     Mental Status: He is oriented to person, place, and time.  Psychiatric:        Behavior: Behavior normal.        Thought Content: Thought content normal.        Judgment: Judgment normal.    BP (!) 123/92 (BP Location: Right Arm, Cuff Size: Large)   Pulse 73   Temp 98.3 F (36.8 C) (Oral)   Resp  16   Ht 5\' 11"  (1.803 m)   Wt 242 lb 3.2 oz (109.9 kg)   SpO2 97%   BMI 33.78 kg/m  Wt Readings from Last 3 Encounters:  07/17/18 242 lb 3.2 oz (109.9 kg)  05/06/15 209 lb 6.4 oz (95 kg)  04/21/15 208 lb 12.8 oz (94.7 kg)     Lab Results  Component Value Date   WBC 5.2 04/21/2015   HGB 15.8 04/21/2015   HCT 46.4 04/21/2015   PLT 215.0 04/21/2015   GLUCOSE 124 (H) 04/21/2015   CHOL 190 04/21/2015   TRIG 39.0 04/21/2015   HDL 77.90 04/21/2015   LDLCALC 104 (H) 04/21/2015   ALT 18 04/21/2015   AST 18 04/21/2015   NA 140 04/21/2015   K 4.1 04/21/2015   CL 107 04/21/2015   CREATININE 0.90 04/21/2015   BUN 15 04/21/2015   CO2 25 04/21/2015   TSH 1.58 04/21/2015   PSA  0.84 04/21/2015    No results found.   Assessment & Plan:  Plan  I have discontinued Johnnette Litter Jr.'s escitalopram and clarithromycin. I am also having him start on azithromycin.  Meds ordered this encounter  Medications  . azithromycin (ZITHROMAX Z-PAK) 250 MG tablet    Sig: As directed    Dispense:  6 each    Refill:  0    Problem List Items Addressed This Visit      Unprioritized   Strep throat - Primary    z pack Tylenol for fever/ pain Drink plenty of liquids        Relevant Medications   azithromycin (ZITHROMAX Z-PAK) 250 MG tablet    Other Visit Diagnoses    Sore throat       Relevant Orders   POCT rapid strep A (Completed)   Fever, unspecified fever cause       Relevant Orders   POC Influenza A&B (Binax test) (Completed)      Follow-up: Return if symptoms worsen or fail to improve.  Donato Schultz, DO

## 2018-07-17 NOTE — Patient Instructions (Signed)
Strep Throat    Strep throat is a bacterial infection of the throat. Your health care provider may call the infection tonsillitis or pharyngitis, depending on whether there is swelling in the tonsils or at the back of the throat. Strep throat is most common during the cold months of the year in children who are 5-56 years of age, but it can happen during any season in people of any age. This infection is spread from person to person (contagious) through coughing, sneezing, or close contact.  What are the causes?  Strep throat is caused by the bacteria called Streptococcus pyogenes.  What increases the risk?  This condition is more likely to develop in:  · People who spend time in crowded places where the infection can spread easily.  · People who have close contact with someone who has strep throat.  What are the signs or symptoms?  Symptoms of this condition include:  · Fever or chills.  · Redness, swelling, or pain in the tonsils or throat.  · Pain or difficulty when swallowing.  · White or yellow spots on the tonsils or throat.  · Swollen, tender glands in the neck or under the jaw.  · Red rash all over the body (rare).  How is this diagnosed?  This condition is diagnosed by performing a rapid strep test or by taking a swab of your throat (throat culture test). Results from a rapid strep test are usually ready in a few minutes, but throat culture test results are available after one or two days.  How is this treated?  This condition is treated with antibiotic medicine.  Follow these instructions at home:  Medicines  · Take over-the-counter and prescription medicines only as told by your health care provider.  · Take your antibiotic as told by your health care provider. Do not stop taking the antibiotic even if you start to feel better.  · Have family members who also have a sore throat or fever tested for strep throat. They may need antibiotics if they have the strep infection.  Eating and drinking  · Do not  share food, drinking cups, or personal items that could cause the infection to spread to other people.  · If swallowing is difficult, try eating soft foods until your sore throat feels better.  · Drink enough fluid to keep your urine clear or pale yellow.  General instructions  · Gargle with a salt-water mixture 3-4 times per day or as needed. To make a salt-water mixture, completely dissolve ½-1 tsp of salt in 1 cup of warm water.  · Make sure that all household members wash their hands well.  · Get plenty of rest.  · Stay home from school or work until you have been taking antibiotics for 24 hours.  · Keep all follow-up visits as told by your health care provider. This is important.  Contact a health care provider if:  · The glands in your neck continue to get bigger.  · You develop a rash, cough, or earache.  · You cough up a thick liquid that is green, yellow-brown, or bloody.  · You have pain or discomfort that does not get better with medicine.  · Your problems seem to be getting worse rather than better.  · You have a fever.  Get help right away if:  · You have new symptoms, such as vomiting, severe headache, stiff or painful neck, chest pain, or shortness of breath.  · You have severe throat   pain, drooling, or changes in your voice.  · You have swelling of the neck, or the skin on the neck becomes red and tender.  · You have signs of dehydration, such as fatigue, dry mouth, and decreased urination.  · You become increasingly sleepy, or you cannot wake up completely.  · Your joints become red or painful.  This information is not intended to replace advice given to you by your health care provider. Make sure you discuss any questions you have with your health care provider.  Document Released: 04/16/2000 Document Revised: 12/17/2015 Document Reviewed: 08/12/2014  [employer] Interactive Patient Education © 2019 [employer]

## 2018-07-18 ENCOUNTER — Encounter: Payer: Self-pay | Admitting: Family Medicine

## 2018-07-18 DIAGNOSIS — J020 Streptococcal pharyngitis: Secondary | ICD-10-CM | POA: Insufficient documentation

## 2018-07-18 NOTE — Assessment & Plan Note (Signed)
z pack Tylenol for fever/ pain Drink plenty of liquids
